# Patient Record
Sex: Male | Born: 1945 | Race: White | Hispanic: No | Marital: Single | Smoking: Former smoker
Health system: Southern US, Community
[De-identification: ages and names within clinical notes are randomized; demographics above are authoritative.]

## PROBLEM LIST (undated history)

## (undated) DIAGNOSIS — I10 Essential (primary) hypertension: Secondary | ICD-10-CM

## (undated) DIAGNOSIS — H9190 Unspecified hearing loss, unspecified ear: Secondary | ICD-10-CM

## (undated) DIAGNOSIS — N189 Chronic kidney disease, unspecified: Secondary | ICD-10-CM

## (undated) DIAGNOSIS — E785 Hyperlipidemia, unspecified: Secondary | ICD-10-CM

## (undated) DIAGNOSIS — I2510 Atherosclerotic heart disease of native coronary artery without angina pectoris: Secondary | ICD-10-CM

## (undated) DIAGNOSIS — E039 Hypothyroidism, unspecified: Secondary | ICD-10-CM

## (undated) DIAGNOSIS — N200 Calculus of kidney: Secondary | ICD-10-CM

## (undated) HISTORY — DX: Chronic kidney disease, unspecified: N18.9

## (undated) HISTORY — PX: LITHOTRIPSY: SUR834

## (undated) HISTORY — DX: Hyperlipidemia, unspecified: E78.5

## (undated) HISTORY — DX: Hypothyroidism, unspecified: E03.9

## (undated) HISTORY — DX: Atherosclerotic heart disease of native coronary artery without angina pectoris: I25.10

---

## 2000-08-20 ENCOUNTER — Encounter: Payer: Self-pay | Admitting: Urology

## 2000-08-20 ENCOUNTER — Ambulatory Visit (HOSPITAL_COMMUNITY): Admission: RE | Admit: 2000-08-20 | Discharge: 2000-08-20 | Payer: Self-pay | Admitting: Urology

## 2000-08-24 ENCOUNTER — Encounter: Payer: Self-pay | Admitting: Internal Medicine

## 2000-08-24 ENCOUNTER — Ambulatory Visit (HOSPITAL_COMMUNITY): Admission: RE | Admit: 2000-08-24 | Discharge: 2000-08-24 | Payer: Self-pay | Admitting: Internal Medicine

## 2000-09-10 ENCOUNTER — Ambulatory Visit (HOSPITAL_COMMUNITY): Admission: RE | Admit: 2000-09-10 | Discharge: 2000-09-10 | Payer: Self-pay

## 2000-09-10 ENCOUNTER — Encounter: Payer: Self-pay | Admitting: Urology

## 2000-09-28 ENCOUNTER — Encounter: Payer: Self-pay | Admitting: Urology

## 2000-09-28 ENCOUNTER — Ambulatory Visit (HOSPITAL_COMMUNITY): Admission: RE | Admit: 2000-09-28 | Discharge: 2000-09-28 | Payer: Self-pay | Admitting: Urology

## 2000-09-30 ENCOUNTER — Ambulatory Visit (HOSPITAL_COMMUNITY): Admission: RE | Admit: 2000-09-30 | Discharge: 2000-09-30 | Payer: Self-pay | Admitting: Urology

## 2000-09-30 ENCOUNTER — Encounter: Payer: Self-pay | Admitting: Urology

## 2000-10-01 ENCOUNTER — Ambulatory Visit (HOSPITAL_COMMUNITY): Admission: RE | Admit: 2000-10-01 | Discharge: 2000-10-01 | Payer: Self-pay | Admitting: Urology

## 2000-10-01 ENCOUNTER — Encounter: Payer: Self-pay | Admitting: Urology

## 2000-11-11 ENCOUNTER — Ambulatory Visit (HOSPITAL_COMMUNITY): Admission: RE | Admit: 2000-11-11 | Discharge: 2000-11-11 | Payer: Self-pay | Admitting: Urology

## 2000-11-11 ENCOUNTER — Encounter: Payer: Self-pay | Admitting: Urology

## 2001-06-07 ENCOUNTER — Encounter: Payer: Self-pay | Admitting: Urology

## 2001-06-07 ENCOUNTER — Ambulatory Visit (HOSPITAL_COMMUNITY): Admission: RE | Admit: 2001-06-07 | Discharge: 2001-06-07 | Payer: Self-pay | Admitting: Urology

## 2006-06-10 ENCOUNTER — Ambulatory Visit (HOSPITAL_COMMUNITY): Admission: RE | Admit: 2006-06-10 | Discharge: 2006-06-10 | Payer: Self-pay | Admitting: Family Medicine

## 2011-07-10 NOTE — H&P (Signed)
  NTS SOAP Note  Vital Signs:  Vitals as of: 07/10/2011: Systolic 149: Diastolic 87: Heart Rate 82: Temp 98.45F: Height 60ft 8in: Weight 181Lbs 0 Ounces: OFC Not Entered: Respiratory Rate Not Entered: O2 Saturation Not Entered: Pain Level Not Entered: BMI 28  BMI : 27.52 kg/m2  Subjective: This 66 Years 1 Months old Male presents forscreening TCS.  Denies any gi complaints.  Never has had a colonoscopy.  No family h/o colon cancer.  Review of Symptoms:  Constitutional:unremarkable Head:unremarkable Eyes:unremarkable Nose/Mouth/Throat:unremarkable Cardiovascular:unremarkable Respiratory:unremarkable Gastrointestinal:unremarkable Genitourinary:unremarkable Musculoskeletal:unremarkable Hematolgic/Lymphatic:unremarkable Allergic/Immunologic:unremarkable   Past Medical History:Reviewed   Past Medical History  Surgical History: none Medical Problems:  High Blood pressure Allergies: nkda Medications: exforge   Social History:Reviewed  Social History  Preferred Language: English ([place]) Race:  White Ethnicity: Not Hispanic / Latino Age: 66 Years 1 Months Marital Status:  S Alcohol: none Recreational drug(s):  No   Smoking Status: Former smoker reviewed on 07/10/2011 Started Date: 02/24/1973 Stopped Date: 02/24/1998   Family History:Reviewed   Family History  Is there a family history of:No family h/o colon cancer             Father:  Diabetes Type II, Coronary Artery Disease             Mother:  Stroke    Objective Information: General:Well appearing, well nourished in no distress. Head:Atraumatic; no masses; no abnormalities Neck:Supple without lymphadenopathy.  Heart:RRR, no murmur or gallop.  Normal S1, S2.  No S3, S4.  Lungs:CTA bilaterally, no wheezes, rhonchi, rales.  Breathing unlabored. Abdomen:Soft, NT/ND, no HSM, no masses. deferred to procedure  Assessment:Need for  screening TCS  Diagnosis &amp; Procedure: DiagnosisCode: V76.51, ProcedureCode: 04540,    Plan:Scheduled for screening TCS on 07/15/11.   Patient Education:Alternative treatments to surgery were discussed with patient (and family).Risks and benefits  of procedure were fully explained to the patient (and family) who gave informed consent. Patient/family questions were addressed.  Follow-up:Pending Surgery

## 2011-07-15 ENCOUNTER — Encounter (HOSPITAL_COMMUNITY): Payer: Self-pay | Admitting: *Deleted

## 2011-07-15 ENCOUNTER — Encounter (HOSPITAL_COMMUNITY)
Admission: RE | Disposition: A | Discharge status: Home / Self Care | Payer: Self-pay | Source: Ambulatory Visit | Attending: General Surgery

## 2011-07-15 ENCOUNTER — Ambulatory Visit (HOSPITAL_COMMUNITY)
Admission: RE | Admit: 2011-07-15 | Discharge: 2011-07-15 | Disposition: A | Discharge status: Home / Self Care | Payer: Medicare Other | Source: Ambulatory Visit | Attending: General Surgery | Admitting: General Surgery

## 2011-07-15 DIAGNOSIS — D126 Benign neoplasm of colon, unspecified: Secondary | ICD-10-CM | POA: Insufficient documentation

## 2011-07-15 DIAGNOSIS — Z79899 Other long term (current) drug therapy: Secondary | ICD-10-CM | POA: Insufficient documentation

## 2011-07-15 DIAGNOSIS — Z01812 Encounter for preprocedural laboratory examination: Secondary | ICD-10-CM | POA: Insufficient documentation

## 2011-07-15 DIAGNOSIS — Z1211 Encounter for screening for malignant neoplasm of colon: Secondary | ICD-10-CM | POA: Insufficient documentation

## 2011-07-15 DIAGNOSIS — I10 Essential (primary) hypertension: Secondary | ICD-10-CM | POA: Insufficient documentation

## 2011-07-15 DIAGNOSIS — D128 Benign neoplasm of rectum: Secondary | ICD-10-CM | POA: Insufficient documentation

## 2011-07-15 HISTORY — PX: COLONOSCOPY: SHX5424

## 2011-07-15 HISTORY — DX: Essential (primary) hypertension: I10

## 2011-07-15 SURGERY — COLONOSCOPY
Anesthesia: Moderate Sedation

## 2011-07-15 MED ORDER — STERILE WATER FOR IRRIGATION IR SOLN
Status: DC | PRN
  Administered 2011-07-15: 10:00:00

## 2011-07-15 MED ORDER — SODIUM CHLORIDE 0.45 % IV SOLN
Freq: Once | INTRAVENOUS | Status: AC
  Administered 2011-07-15: 10:00:00 via INTRAVENOUS

## 2011-07-15 MED ORDER — MEPERIDINE HCL 25 MG/ML IJ SOLN
INTRAMUSCULAR | Status: DC | PRN
  Administered 2011-07-15: 50 mg via INTRAVENOUS

## 2011-07-15 MED ORDER — MEPERIDINE HCL 100 MG/ML IJ SOLN
INTRAMUSCULAR | Status: AC
  Filled 2011-07-15: qty 1

## 2011-07-15 MED ORDER — MIDAZOLAM HCL 5 MG/5ML IJ SOLN
INTRAMUSCULAR | Status: DC | PRN
  Administered 2011-07-15 (×2): 1 mg via INTRAVENOUS
  Administered 2011-07-15: 3 mg via INTRAVENOUS

## 2011-07-15 MED ORDER — MIDAZOLAM HCL 5 MG/5ML IJ SOLN
INTRAMUSCULAR | Status: AC
  Filled 2011-07-15: qty 5

## 2011-07-15 NOTE — Discharge Instructions (Addendum)
Colonoscopy Care After Read the instructions outlined below and refer to this sheet in the next few weeks. These discharge instructions provide you with general information on caring for yourself after you leave the hospital. Your doctor may also give you specific instructions. While your treatment has been planned according to the most current medical practices available, unavoidable complications occasionally occur. If you have any problems or questions after discharge, call your doctor. HOME CARE INSTRUCTIONS ACTIVITY:  You may resume your regular activity, but move at a slower pace for the next 24 hours.   Take frequent rest periods for the next 24 hours.   Walking will help get rid of the air and reduce the bloated feeling in your belly (abdomen).   No driving for 24 hours (because of the medicine (anesthesia) used during the test).   You may shower.   Do not sign any important legal documents or operate any machinery for 24 hours (because of the anesthesia used during the test).  NUTRITION:  Drink plenty of fluids.   You may resume your normal diet as instructed by your doctor.   Begin with a light meal and progress to your normal diet. Heavy or fried foods are harder to digest and may make you feel sick to your stomach (nauseated).   Avoid alcoholic beverages for 24 hours or as instructed.  MEDICATIONS:  You may resume your normal medications unless your doctor tells you otherwise.  WHAT TO EXPECT TODAY:  Some feelings of bloating in the abdomen.   Passage of more gas than usual.   Spotting of blood in your stool or on the toilet paper.  IF YOU HAD POLYPS REMOVED DURING THE COLONOSCOPY:  No aspirin products for 7 days or as instructed.   No alcohol for 7 days or as instructed.   Eat a soft diet for the next 24 hours.  FINDING OUT THE RESULTS OF YOUR TEST Not all test results are available during your visit. If your test results are not back during the visit, make an  appointment with your caregiver to find out the results. Do not assume everything is normal if you have not heard from your caregiver or the medical facility. It is important for you to follow up on all of your test results.  SEEK IMMEDIATE MEDICAL CARE IF:  You have more than a spotting of blood in your stool.   Your belly is swollen (abdominal distention).   You are nauseated or vomiting.   You have a fever.   You have abdominal pain or discomfort that is severe or gets worse throughout the day.  Document Released: 09/25/2003 Document Revised: 01/30/2011 Document Reviewed: 09/23/2007 [facility] Patient Information 2012 [place], [place].Colon Polyps A polyp is extra tissue that grows inside your body. Colon polyps grow in the large intestine. The large intestine, also called the colon, is part of your digestive system. It is a long, hollow tube at the end of your digestive tract where your body makes and stores stool. Most polyps are not dangerous. They are benign. This means they are not cancerous. But over time, some types of polyps can turn into cancer. Polyps that are smaller than a pea are usually not harmful. But larger polyps could someday become or may already be cancerous. To be safe, doctors remove all polyps and test them.  WHO GETS POLYPS? Anyone can get polyps, but certain people are more likely than others. You may have a greater chance of getting polyps if:  You  are over 50.   You have had polyps before.   Someone in your family has had polyps.   Someone in your family has had cancer of the large intestine.   Find out if someone in your family has had polyps. You may also be more likely to get polyps if you:   Eat a lot of fatty foods.   Smoke.   Drink alcohol.   Do not exercise.   Eat too much.  SYMPTOMS  Most small polyps do not cause symptoms. People often do not know they have one until their caregiver finds it during a regular checkup or while testing them for  something else. Some people do have symptoms like these:  Bleeding from the anus. You might notice blood on your underwear or on toilet paper after you have had a bowel movement.   Constipation or diarrhea that lasts more than a week.   Blood in the stool. Blood can make stool look black or it can show up as red streaks in the stool.  If you have any of these symptoms, see your caregiver. HOW DOES THE DOCTOR TEST FOR POLYPS? The doctor can use four tests to check for polyps:  Digital rectal exam. The caregiver wears gloves and checks your rectum (the last part of the large intestine) to see if it feels normal. This test would find polyps only in the rectum. Your caregiver may need to do one of the other tests listed below to find polyps higher up in the intestine.   Barium enema. The caregiver puts a liquid called barium into your rectum before taking x-rays of your large intestine. Barium makes your intestine look white in the pictures. Polyps are dark, so they are easy to see.   Sigmoidoscopy. With this test, the caregiver can see inside your large intestine. A thin flexible tube is placed into your rectum. The device is called a sigmoidoscope, which has a light and a tiny video camera in it. The caregiver uses the sigmoidoscope to look at the last third of your large intestine.   Colonoscopy. This test is like sigmoidoscopy, but the caregiver looks at all of the large intestine. It usually requires sedation. This is the most common method for finding and removing polyps.  TREATMENT   The caregiver will remove the polyp during sigmoidoscopy or colonoscopy. The polyp is then tested for cancer.   If you have had polyps, your caregiver may want you to get tested regularly in the future.  PREVENTION  There is not one sure way to prevent polyps. You might be able to lower your risk of getting them if you:  Eat more fruits and vegetables and less fatty food.   Do not smoke.   Avoid  alcohol.   Exercise every day.   Lose weight if you are overweight.   Eating more calcium and folate can also lower your risk of getting polyps. Some foods that are rich in calcium are milk, cheese, and broccoli. Some foods that are rich in folate are chickpeas, kidney beans, and spinach.   Aspirin might help prevent polyps. Studies are under way.  Document Released: 11/07/2003 Document Revised: 01/30/2011 Document Reviewed: 04/14/2007 [facility] Patient Information 2012 [place], [place].Colon Polyps A polyp is extra tissue that grows inside your body. Colon polyps grow in the large intestine. The large intestine, also called the colon, is part of your digestive system. It is a long, hollow tube at the end of your digestive tract where your  body makes and stores stool. Most polyps are not dangerous. They are benign. This means they are not cancerous. But over time, some types of polyps can turn into cancer. Polyps that are smaller than a pea are usually not harmful. But larger polyps could someday become or may already be cancerous. To be safe, doctors remove all polyps and test them.  WHO GETS POLYPS? Anyone can get polyps, but certain people are more likely than others. You may have a greater chance of getting polyps if:  You are over 50.   You have had polyps before.   Someone in your family has had polyps.   Someone in your family has had cancer of the large intestine.   Find out if someone in your family has had polyps. You may also be more likely to get polyps if you:   Eat a lot of fatty foods.   Smoke.   Drink alcohol.   Do not exercise.   Eat too much.  SYMPTOMS  Most small polyps do not cause symptoms. People often do not know they have one until their caregiver finds it during a regular checkup or while testing them for something else. Some people do have symptoms like these:  Bleeding from the anus. You might notice blood on your underwear or on toilet paper after you  have had a bowel movement.   Constipation or diarrhea that lasts more than a week.   Blood in the stool. Blood can make stool look black or it can show up as red streaks in the stool.  If you have any of these symptoms, see your caregiver. HOW DOES THE DOCTOR TEST FOR POLYPS? The doctor can use four tests to check for polyps:  Digital rectal exam. The caregiver wears gloves and checks your rectum (the last part of the large intestine) to see if it feels normal. This test would find polyps only in the rectum. Your caregiver may need to do one of the other tests listed below to find polyps higher up in the intestine.   Barium enema. The caregiver puts a liquid called barium into your rectum before taking x-rays of your large intestine. Barium makes your intestine look white in the pictures. Polyps are dark, so they are easy to see.   Sigmoidoscopy. With this test, the caregiver can see inside your large intestine. A thin flexible tube is placed into your rectum. The device is called a sigmoidoscope, which has a light and a tiny video camera in it. The caregiver uses the sigmoidoscope to look at the last third of your large intestine.   Colonoscopy. This test is like sigmoidoscopy, but the caregiver looks at all of the large intestine. It usually requires sedation. This is the most common method for finding and removing polyps.  TREATMENT   The caregiver will remove the polyp during sigmoidoscopy or colonoscopy. The polyp is then tested for cancer.   If you have had polyps, your caregiver may want you to get tested regularly in the future.  PREVENTION  There is not one sure way to prevent polyps. You might be able to lower your risk of getting them if you:  Eat more fruits and vegetables and less fatty food.   Do not smoke.   Avoid alcohol.   Exercise every day.   Lose weight if you are overweight.   Eating more calcium and folate can also lower your risk of getting polyps. Some foods  that are rich in calcium are  milk, cheese, and broccoli. Some foods that are rich in folate are chickpeas, kidney beans, and spinach.   Aspirin might help prevent polyps. Studies are under way.  Document Released: 11/07/2003 Document Revised: 01/30/2011 Document Reviewed: 04/14/2007 [facility] Patient Information 2012 [place], [place].

## 2011-07-15 NOTE — Interval H&P Note (Signed)
History and Physical Interval Note:  07/15/2011 10:03 AM  [name]  has presented today for surgery, with the diagnosis of Special screening for malignant neoplasms, colon   The various methods of treatment have been discussed with the patient and family. After consideration of risks, benefits and other options for treatment, the patient has consented to  Procedure(s) (LRB): COLONOSCOPY (N/A) as a surgical intervention .  The patients' history has been reviewed, patient examined, no change in status, stable for surgery.  I have reviewed the patients' chart and labs.  Questions were answered to the patient's satisfaction.     Franky Macho A

## 2011-07-15 NOTE — Op Note (Signed)
[facility] [address] [place], [place]  [address]  COLONOSCOPY PROCEDURE REPORT  PATIENT:  Tanner, Bass  MR#:  [id number] BIRTHDATE:  08/12/1945, 66 yrs. old  GENDER:  male ENDOSCOPIST:  Franky Macho, MD REF. BY:  Assunta Found, M.D. PROCEDURE DATE:  07/15/2011 PROCEDURE:  Colonoscopy with snare polypectomy ASA CLASS:  Class II INDICATIONS:  Screening MEDICATIONS:   Versed 4 mg IV, demerol 50 mg IV  DESCRIPTION OF PROCEDURE:   After the risks benefits and alternatives of the procedure were thoroughly explained, informed consent was obtained.  Digital rectal exam was performed and revealed no abnormalities.   The [id number] ([id number]) endoscope was introduced through the anus and advanced to the cecum, which was identified by both the appendix and ileocecal valve, without limitations.  The quality of the prep was adequate..  The instrument was then slowly withdrawn as the colon was fully examined. <<PROCEDUREIMAGES>>  FINDINGS:  Two polyps were found in the proximal transverse colon (see image001, image002, image003).  A sessile polyp was found in the rectum (image 004).   All were removed using snare cautery and sent to pathology.  Retroflexed views in the rectum revealed it was not tolerated by the patient.  The scope was then withdrawn from the cecum and the procedure completed. COMPLICATIONS:  None ENDOSCOPIC IMPRESSION: 1) Two polyps in the proximal transverse colon 2) Sessile polyp in the rectum  3) Normal colonoscopy otherwise RECOMMENDATIONS: 1) Await pathology results REPEAT EXAM:  In 3 year(s) for Colonoscopy.  ______________________________ Franky Macho, MD  CC:  Assunta Found, MD  n. Rosalie DoctorFranky Macho at 07/15/2011 10:31 AM  Tommas Olp, [phone]

## 2011-07-16 LAB — GLUCOSE, CAPILLARY: Glucose-Capillary: 98 mg/dL (ref 70–99)

## 2011-07-17 ENCOUNTER — Encounter (HOSPITAL_COMMUNITY): Payer: Self-pay | Admitting: General Surgery

## 2012-03-17 ENCOUNTER — Encounter (HOSPITAL_COMMUNITY): Payer: Self-pay | Admitting: *Deleted

## 2012-03-17 ENCOUNTER — Emergency Department (HOSPITAL_COMMUNITY)
Admission: EM | Admit: 2012-03-17 | Discharge: 2012-03-18 | Disposition: A | Discharge status: Home / Self Care | Payer: Medicare Other | Attending: Emergency Medicine | Admitting: Emergency Medicine

## 2012-03-17 DIAGNOSIS — I10 Essential (primary) hypertension: Secondary | ICD-10-CM | POA: Insufficient documentation

## 2012-03-17 DIAGNOSIS — E119 Type 2 diabetes mellitus without complications: Secondary | ICD-10-CM | POA: Insufficient documentation

## 2012-03-17 DIAGNOSIS — Z87891 Personal history of nicotine dependence: Secondary | ICD-10-CM | POA: Insufficient documentation

## 2012-03-17 DIAGNOSIS — Z79899 Other long term (current) drug therapy: Secondary | ICD-10-CM | POA: Insufficient documentation

## 2012-03-17 DIAGNOSIS — R109 Unspecified abdominal pain: Secondary | ICD-10-CM | POA: Insufficient documentation

## 2012-03-17 DIAGNOSIS — N200 Calculus of kidney: Secondary | ICD-10-CM | POA: Insufficient documentation

## 2012-03-17 MED ORDER — SODIUM CHLORIDE 0.9 % IV SOLN
Freq: Once | INTRAVENOUS | Status: AC
  Administered 2012-03-18: via INTRAVENOUS

## 2012-03-17 MED ORDER — MORPHINE SULFATE 4 MG/ML IJ SOLN
2.0000 mg | Freq: Once | INTRAMUSCULAR | Status: AC
  Administered 2012-03-18: 2 mg via INTRAVENOUS
  Filled 2012-03-17: qty 1

## 2012-03-17 MED ORDER — ONDANSETRON HCL 4 MG/2ML IJ SOLN
4.0000 mg | Freq: Once | INTRAMUSCULAR | Status: AC
  Administered 2012-03-18: 4 mg via INTRAVENOUS
  Filled 2012-03-17: qty 2

## 2012-03-17 NOTE — ED Notes (Addendum)
Pt reporting sudden, sharp pain in LLQ. States pain began about 9:30 tonight.  Denies nausea and vomiting.  Denies problems with bowel movements. Pt reports that pain has lessened now, and is "just throbbing."

## 2012-03-18 ENCOUNTER — Emergency Department (HOSPITAL_COMMUNITY): Payer: Medicare Other

## 2012-03-18 LAB — BASIC METABOLIC PANEL
BUN: 15 mg/dL (ref 6–23)
Calcium: 9.5 mg/dL (ref 8.4–10.5)
Creatinine, Ser: 1.13 mg/dL (ref 0.50–1.35)
GFR calc non Af Amer: 65 mL/min — ABNORMAL LOW (ref >90)
Glucose, Bld: 128 mg/dL — ABNORMAL HIGH (ref 70–99)

## 2012-03-18 LAB — URINE MICROSCOPIC-ADD ON

## 2012-03-18 LAB — URINALYSIS, ROUTINE W REFLEX MICROSCOPIC
Bilirubin Urine: NEGATIVE
Protein, ur: NEGATIVE mg/dL
Urobilinogen, UA: 0.2 mg/dL (ref 0.0–1.0)

## 2012-03-18 LAB — CBC WITH DIFFERENTIAL/PLATELET
Eosinophils Absolute: 0.1 10*3/uL (ref 0.0–0.7)
Eosinophils Relative: 2 % (ref 0–5)
Lymphs Abs: 1.9 10*3/uL (ref 0.7–4.0)
MCH: 23.1 pg — ABNORMAL LOW (ref 26.0–34.0)
MCV: 75.3 fL — ABNORMAL LOW (ref 78.0–100.0)
Monocytes Relative: 7 % (ref 3–12)
Platelets: 205 10*3/uL (ref 150–400)
RBC: 4.41 MIL/uL (ref 4.22–5.81)

## 2012-03-18 MED ORDER — POTASSIUM CHLORIDE CRYS ER 20 MEQ PO TBCR
60.0000 meq | EXTENDED_RELEASE_TABLET | Freq: Once | ORAL | Status: AC
  Administered 2012-03-18: 60 meq via ORAL
  Filled 2012-03-18: qty 3

## 2012-03-18 MED ORDER — HYDROMORPHONE HCL PF 1 MG/ML IJ SOLN
1.0000 mg | Freq: Once | INTRAMUSCULAR | Status: DC
  Filled 2012-03-18: qty 1

## 2012-03-18 MED ORDER — HYDROCODONE-ACETAMINOPHEN 5-325 MG PO TABS: 1.0000 | ORAL_TABLET | ORAL | Status: AC | PRN

## 2012-03-18 MED ORDER — KETOROLAC TROMETHAMINE 30 MG/ML IJ SOLN
30.0000 mg | Freq: Once | INTRAMUSCULAR | Status: AC
  Administered 2012-03-18: 30 mg via INTRAVENOUS
  Filled 2012-03-18: qty 1

## 2012-03-18 MED ORDER — CIPROFLOXACIN HCL 500 MG PO TABS: 250.0000 mg | ORAL_TABLET | Freq: Two times a day (BID) | ORAL | Status: DC

## 2012-03-18 MED ORDER — ONDANSETRON HCL 4 MG PO TABS: 4.0000 mg | ORAL_TABLET | Freq: Four times a day (QID) | ORAL | Status: DC

## 2012-03-18 NOTE — ED Notes (Signed)
Discharge instructions given and reviewed with patient.  Prescriptions given for Vicodin, Zofran and Cipro; effects and use explained for each.  Patient verbalized understanding of sedating effects of Vicodin and to complete all Cipro.  Patient to follow up with Dr. Jerre Simon for an 8mm kidney stone.  Patient ambulatory; discharged home in good condition.

## 2012-03-18 NOTE — ED Notes (Signed)
Patient in radiology at this time. 

## 2012-03-18 NOTE — ED Notes (Signed)
CRITICAL VALUE ALERT  Critical value received: K 2.4 Date of notification: 03/18/12  Time of notification:  0031  Critical value read back: yes Nurse who received alert:  Jeannette How, RN  MD notified:Dr. Colon Branch  Time: 8413

## 2012-03-18 NOTE — ED Provider Notes (Signed)
History     CSN: [id number]  Arrival date & time 03/17/12  2330   First MD Initiated Contact with Patient 03/17/12 2341      Chief Complaint  Patient presents with  . Abdominal Pain    (Consider location/radiation/quality/duration/timing/severity/associated sxs/prior treatment) HPI [name] is a 67 y.o. male who presents to the Emergency Department complaining of burning abdominal pain to the left of the umbilicus that began around 2100. He has had two BMs since onset of the discomfort with no diarrhea. Pain is not associated with nausea, vomiting, or diarrhea. He has taken no medicines.   PCP Dr. Phillips Odor Past Medical History  Diagnosis Date  . Hypertension   . Diabetes mellitus   . Kidney stones     Past Surgical History  Procedure Date  . Lithotripsy     X 2  . Colonoscopy 07/15/2011    Procedure: COLONOSCOPY;  Surgeon: Dalia Heading, MD;  Location: [facility];  Service: Gastroenterology;  Laterality: N/A;    Family History  Problem Relation Age of Onset  . Colon cancer Paternal Uncle     History  Substance Use Topics  . Smoking status: Former Smoker -- 1.0 packs/day for 20 years    Types: Cigarettes  . Smokeless tobacco: Former Neurosurgeon    Types: Snuff, Chew  . Alcohol Use: No      Review of Systems  Constitutional: Negative for fever.       10 Systems reviewed and are negative for acute change except as noted in the HPI.  HENT: Negative for congestion.   Eyes: Negative for discharge and redness.  Respiratory: Negative for cough and shortness of breath.   Cardiovascular: Negative for chest pain.  Gastrointestinal: Positive for abdominal pain. Negative for vomiting.  Musculoskeletal: Negative for back pain.  Skin: Negative for rash.  Neurological: Negative for syncope, numbness and headaches.  Psychiatric/Behavioral:       No behavior change.    Allergies  Review of patient's allergies indicates no known allergies.  Home Medications    Current Outpatient Rx  Name  Route  Sig  Dispense  Refill  . AMLODIPINE BESYLATE-VALSARTAN 5-160 MG PO TABS   Oral   Take 1 tablet by mouth daily.         Marland Kitchen CLOTRIMAZOLE-BETAMETHASONE 1-0.05 % EX CREA   Topical   Apply topically 2 (two) times daily as needed.         Marland Kitchen GABAPENTIN 100 MG PO CAPS   Oral   Take 100 mg by mouth 2 (two) times daily.         Marland Kitchen METFORMIN HCL 500 MG PO TABS   Oral   Take 500 mg by mouth 2 (two) times daily with a meal.         . SIMVASTATIN 20 MG PO TABS   Oral   Take 20 mg by mouth daily.           BP 171/91  Pulse 77  Temp 98.4 F (36.9 C) (Oral)  Resp 16  Ht 5\' 8"  (1.727 m)  Wt 190 lb (86.183 kg)  BMI 28.89 kg/m2  SpO2 99%  Physical Exam  Nursing note and vitals reviewed. Constitutional:       Awake, alert, nontoxic appearance.  HENT:  Head: Atraumatic.  Eyes: Right eye exhibits no discharge. Left eye exhibits no discharge.  Neck: Neck supple.  Pulmonary/Chest: Effort normal. He exhibits no tenderness.  Abdominal: Soft. There is tenderness. There is no  rebound.       Mild tenderness to left abdomen with palpation.   Musculoskeletal: He exhibits no tenderness.       Baseline ROM, no obvious new focal weakness.  Neurological:       Mental status and motor strength appears baseline for patient and situation.  Skin: No rash noted.  Psychiatric: He has a normal mood and affect.    ED Course  Procedures (including critical care time) Results for orders placed during the hospital encounter of 03/17/12  CBC WITH DIFFERENTIAL      Component Value Range   WBC 6.2  4.0 - 10.5 K/uL   RBC 4.41  4.22 - 5.81 MIL/uL   Hemoglobin 10.2 (*) 13.0 - 17.0 g/dL   HCT 62.1 (*) 30.8 - 65.7 %   MCV 75.3 (*) 78.0 - 100.0 fL   MCH 23.1 (*) 26.0 - 34.0 pg   MCHC 30.7  30.0 - 36.0 g/dL   RDW 84.6 (*) 96.2 - 95.2 %   Platelets 205  150 - 400 K/uL   Neutrophils Relative 61  43 - 77 %   Neutro Abs 3.8  1.7 - 7.7 K/uL   Lymphocytes  Relative 30  12 - 46 %   Lymphs Abs 1.9  0.7 - 4.0 K/uL   Monocytes Relative 7  3 - 12 %   Monocytes Absolute 0.4  0.1 - 1.0 K/uL   Eosinophils Relative 2  0 - 5 %   Eosinophils Absolute 0.1  0.0 - 0.7 K/uL   Basophils Relative 0  0 - 1 %   Basophils Absolute 0.0  0.0 - 0.1 K/uL  BASIC METABOLIC PANEL      Component Value Range   Sodium 141  135 - 145 mEq/L   Potassium 2.4 (*) 3.5 - 5.1 mEq/L   Chloride 102  96 - 112 mEq/L   CO2 31  19 - 32 mEq/L   Glucose, Bld 128 (*) 70 - 99 mg/dL   BUN 15  6 - 23 mg/dL   Creatinine, Ser 8.41  0.50 - 1.35 mg/dL   Calcium 9.5  8.4 - 32.4 mg/dL   GFR calc non Af Amer 65 (*) >90 mL/min   GFR calc Af Amer 75 (*) >90 mL/min  URINALYSIS, ROUTINE W REFLEX MICROSCOPIC      Component Value Range   Color, Urine RED (*) YELLOW   APPearance HAZY (*) CLEAR   Specific Gravity, Urine 1.015  1.005 - 1.030   pH 7.0  5.0 - 8.0   Glucose, UA NEGATIVE  NEGATIVE mg/dL   Hgb urine dipstick LARGE (*) NEGATIVE   Bilirubin Urine NEGATIVE  NEGATIVE   Ketones, ur NEGATIVE  NEGATIVE mg/dL   Protein, ur NEGATIVE  NEGATIVE mg/dL   Urobilinogen, UA 0.2  0.0 - 1.0 mg/dL   Nitrite NEGATIVE  NEGATIVE   Leukocytes, UA TRACE (*) NEGATIVE  URINE MICROSCOPIC-ADD ON      Component Value Range   Squamous Epithelial / LPF RARE  RARE   WBC, UA 0-2  <3 WBC/hpf   RBC / HPF TOO NUMEROUS TO COUNT  <3 RBC/hpf   Bacteria, UA FEW (*) RARE   Urine-Other MUCOUS PRESENT      Dg Abd Acute W/chest  03/18/2012  *RADIOLOGY REPORT*  Clinical Data: Left lower quadrant pain.  History of kidney stones.  ACUTE ABDOMEN SERIES (ABDOMEN 2 VIEW & CHEST 1 VIEW)  Comparison: None.  Findings: Chest radiograph demonstrates clear lungs.  Streaky densities at  the lung bases may be related to vascular structures. Heart size is within normal limits.  No evidence for free air.  There are large calculi in the right abdomen that could be near the renal pelvis.  The conglomeration of stones measures 1.8 x 1.7 cm.  There are additional right kidney stones.  There is an 8 mm stone in the region of the proximal left ureter.  There are no definite stones in the pelvis.  There is a nonspecific bowel gas pattern.  IMPRESSION: 8 mm stone in the left abdomen.  The stone could be located in the proximal left ureter.  Multiple calcifications in the region of the right kidney and right renal pelvis. Conglomeration of stones measure up to 1.8 cm in this region.  Nonobstructive bowel gas pattern.  No acute chest findings.   Original Report Authenticated By: Richarda Overlie, M.D.    Mikkel.Chamorro Reviewed results with patient. He advised that last kidney stone required lithotripsy 9 years ago. He saw Dr. Jerre Simon at that time.  MDM  Patient with left sided abdominal pain that began tonight. Acute abdominal series with 8 mm stone on left. Calcifications in right kidney pelvis. Labs with low potassium at 2.4. Given potassium. Referral to urology. Reviewed results with patient. Pt stable in ED with no significant deterioration in condition.The patient appears reasonably screened and/or stabilized for discharge and I doubt any other medical condition or other [facility] requiring further screening, evaluation, or treatment in the ED at this time prior to discharge.  MDM Reviewed: nursing note and vitals Interpretation: labs and x-ray           Nicoletta Dress. Colon Branch, MD 03/18/12 [phone]

## 2012-03-22 ENCOUNTER — Other Ambulatory Visit (HOSPITAL_COMMUNITY): Payer: Self-pay | Admitting: Urology

## 2012-03-22 DIAGNOSIS — N201 Calculus of ureter: Secondary | ICD-10-CM

## 2012-03-22 DIAGNOSIS — N200 Calculus of kidney: Secondary | ICD-10-CM

## 2012-03-23 ENCOUNTER — Ambulatory Visit (HOSPITAL_COMMUNITY): Payer: Medicare Other

## 2012-03-26 ENCOUNTER — Ambulatory Visit (HOSPITAL_COMMUNITY)
Admission: RE | Admit: 2012-03-26 | Discharge: 2012-03-26 | Disposition: A | Discharge status: Home / Self Care | Payer: Medicare Other | Source: Ambulatory Visit | Attending: Urology | Admitting: Urology

## 2012-03-26 DIAGNOSIS — R109 Unspecified abdominal pain: Secondary | ICD-10-CM | POA: Insufficient documentation

## 2012-03-26 DIAGNOSIS — N201 Calculus of ureter: Secondary | ICD-10-CM | POA: Insufficient documentation

## 2012-03-26 DIAGNOSIS — N200 Calculus of kidney: Secondary | ICD-10-CM | POA: Insufficient documentation

## 2012-03-26 DIAGNOSIS — R918 Other nonspecific abnormal finding of lung field: Secondary | ICD-10-CM | POA: Insufficient documentation

## 2012-03-31 ENCOUNTER — Ambulatory Visit: Admit: 2012-03-31 | Payer: Self-pay | Admitting: Urology

## 2012-03-31 SURGERY — LITHOTRIPSY, ESWL
Anesthesia: Moderate Sedation | Laterality: Left

## 2012-05-19 ENCOUNTER — Encounter (HOSPITAL_COMMUNITY): Payer: Self-pay

## 2012-05-19 ENCOUNTER — Other Ambulatory Visit: Payer: Self-pay

## 2012-05-19 ENCOUNTER — Encounter (HOSPITAL_COMMUNITY)
Admission: RE | Admit: 2012-05-19 | Discharge: 2012-05-19 | Disposition: A | Discharge status: Home / Self Care | Payer: Medicare Other | Source: Ambulatory Visit | Attending: Urology | Admitting: Urology

## 2012-05-19 ENCOUNTER — Encounter (HOSPITAL_COMMUNITY): Payer: Self-pay | Admitting: Pharmacy Technician

## 2012-05-19 DIAGNOSIS — Z01812 Encounter for preprocedural laboratory examination: Secondary | ICD-10-CM | POA: Insufficient documentation

## 2012-05-19 DIAGNOSIS — N201 Calculus of ureter: Secondary | ICD-10-CM | POA: Insufficient documentation

## 2012-05-19 DIAGNOSIS — Z01810 Encounter for preprocedural cardiovascular examination: Secondary | ICD-10-CM | POA: Insufficient documentation

## 2012-05-19 DIAGNOSIS — Z5309 Procedure and treatment not carried out because of other contraindication: Secondary | ICD-10-CM | POA: Insufficient documentation

## 2012-05-19 HISTORY — DX: Calculus of kidney: N20.0

## 2012-05-19 LAB — BASIC METABOLIC PANEL
BUN: 18 mg/dL (ref 6–23)
CO2: 31 mEq/L (ref 19–32)
GFR calc non Af Amer: 41 mL/min — ABNORMAL LOW (ref >90)
Glucose, Bld: 98 mg/dL (ref 70–99)
Potassium: 2.6 mEq/L — CL (ref 3.5–5.1)
Sodium: 143 mEq/L (ref 135–145)

## 2012-05-19 NOTE — Progress Notes (Signed)
Pt's pre-op labwork for lithotripsy on 05/26/12 with Dr. Jerre Simon showed a critical K+ value of 2.6. Dr. Jerre Simon is currently out of town, so his PCP was contacted to report the critical value at 1230 on 05/19/12. Pt's PCP, Dr. Assunta Found, is currently out of town, so I spoke with Terie Purser, PA at his office. She said she would contact the pt and replace his potassium orally and have him see her in the office tomorrow.

## 2012-05-19 NOTE — Patient Instructions (Signed)
[name]  05/19/2012   Your procedure is scheduled on:  05/26/12  Report to [facility] at 12:30 PM.  Call this number if you have problems the morning of surgery: [phone]   Remember:   Do not eat food or drink liquids after midnight.   Take these medicines the morning of surgery with A SIP OF WATER: Exforge. Take Xanax if needed.   Do not wear jewelry, make-up or nail polish.  Do not wear lotions, powders, or perfumes. You may wear deodorant.  Do not shave 48 hours prior to surgery. Men may shave face and neck.  Do not bring valuables to the hospital.  Contacts, dentures or bridgework may not be worn into surgery.  Leave suitcase in the car. After surgery it may be brought to your room.  For patients admitted to the hospital, checkout time is 11:00 AM the day of discharge.   Patients discharged the day of surgery will not be allowed to drive home.    Special Instructions: N/A   Please read over the following fact sheets that you were given: Pain Booklet, Anesthesia Post-op Instructions and Care and Recovery After Surgery    Lithotripsy for Kidney Stones WHAT ARE KIDNEY STONES? The kidneys filter blood for chemicals the body cannot use. These waste chemicals are eliminated in the urine. They are removed from the body. Under some conditions, these chemicals may become concentrated. When this happens, they form crystals in the urine. When these crystals build up and stick together, stones may form. When these stones block the flow of urine through the urinary tract, they may cause severe pain. The urinary tract is very sensitive to blockage and stretching by the stone. WHAT IS LITHOTRIPSY? Lithotripsy is a treatment that can sometimes help eliminate kidney stones and pain faster. A form of lithotripsy, also known as ESWL (extracorporeal shock wave lithotripsy), is a nonsurgical procedure that helps your body rid itself of the kidney stone with a minimum amount of pain. EWSL is a  method of crushing a kidney stone with shock waves. These shock waves pass through your body. They cause the kidney stones to crumble while still in the urinary tract. It is then easier for the smaller pieces of stone to pass in the urine. Lithotripsy usually takes about an hour. It is done in a hospital, a lithotripsy center, or a mobile unit. It usually does not require an overnight stay. Your caregiver will instruct you on preparation for the procedure. Your caregiver will tell you what to expect afterward. LET YOUR CAREGIVER KNOW ABOUT:  Allergies.  Medicines taken including herbs, eye drops, over the counter medicines (including aspirin, aleve, or motrin for treatment of inflammatory conditions) and creams.  Use of steroids (by mouth or creams).  Previous problems with anesthetics or novocaine.  Possibility of pregnancy, if this applies.  History of blood clots (thrombophlebitis).  History of bleeding or blood problems.  Previous surgery.  Other health problems. RISKS AND COMPLICATIONS Complications of lithotripsy are uncommon, but include the following:  Infection.  Bleeding of the kidney.  Bruising of the kidney or skin.  Obstruction of the ureter (the passageway from the kidney to the bladder).  Failure of the stone to fragment (break apart). PROCEDURE A stent (flexible tube with holes) may be placed in your ureter. The ureter is the tube that transports the urine from the kidneys to the bladder. Your caregiver may place a stent before the procedure. This will help keep urine flowing from  the kidney if the fragments of the stone block the ureter. You may receive an intravenous (IV) line to give you fluids and medicines. These medicines may help you relax or make you sleep. During the procedure, you will lie comfortably on a fluid-filled cushion or in a warm-water bath. After an x-ray or ultrasound locates your stone, shock waves are aimed at the stone. If you are awake, you  may feel a tapping sensation (feeling) as the shock waves pass through your body. If large stone particles remain after treatment, a second procedure may be necessary at a later date. For comfort during the test:  Relax as much as possible.  Try to remain still as much as possible.  Try to follow instructions to speed up the test.  Let your caregiver know if you are uncomfortable, anxious, or in pain. AFTER THE PROCEDURE  After surgery, you will be taken to the recovery area. A nurse will watch and check your progress. Once you're awake, stable, and taking fluids well, you will be allowed to go home as long as there are no problems. You may be prescribed antibiotics (medicines that kill germs) to help prevent infection. You may also be prescribed pain medicine if needed. In a week or two, your doctor may remove your stent, if you have one. Your caregiver will check to see whether or not stone particles remain. PASSING THE STONE It may take anywhere from a day to several weeks for the stone particles to leave your body. During this time, drink at least 8 to 12 eight ounce glasses of water every day. It is normal for your urine to be cloudy or slightly bloody for a few weeks following this procedure. You may even see small pieces of stone in your urine. A slight fever and some pain are also normal. Your caregiver may ask you to strain your urine to collect some stone particles for chemical analysis. If you find particles while straining the urine, save them. Analysis tells you and the caregiver what the stone is made of. Knowing this may help prevent future stones. PREVENTING FUTURE STONES  Drink about 8 to 12, eight-ounce glasses of water every day.  Follow the diet your caregiver recommends.  Take your prescribed medicine.  See your caregiver regularly for checkups. SEEK IMMEDIATE MEDICAL CARE IF:  You develop an oral temperature above 102 F (38.9 C), or as your caregiver suggests.  Your  pain is not relieved by medicine.  You develop nausea (feeling sick to your stomach) and vomiting.  You develop heavy bleeding.  You have difficulty urinating. Document Released: 02/08/2000 Document Revised: 05/05/2011 Document Reviewed: 12/03/2007 [facility] Patient Information 2013 [place], [place].    PATIENT INSTRUCTIONS POST-ANESTHESIA  IMMEDIATELY FOLLOWING SURGERY:  Do not drive or operate machinery for the first twenty four hours after surgery.  Do not make any important decisions for twenty four hours after surgery or while taking narcotic pain medications or sedatives.  If you develop intractable nausea and vomiting or a severe headache please notify your doctor immediately.  FOLLOW-UP:  Please make an appointment with your surgeon as instructed. You do not need to follow up with anesthesia unless specifically instructed to do so.  WOUND CARE INSTRUCTIONS (if applicable):  Keep a dry clean dressing on the anesthesia/puncture wound site if there is drainage.  Once the wound has quit draining you may leave it open to air.  Generally you should leave the bandage intact for twenty four hours unless there is  drainage.  If the epidural site drains for more than 36-48 hours please call the anesthesia department.  QUESTIONS?:  Please feel free to call your physician or the hospital operator if you have any questions, and they will be happy to assist you.

## 2012-05-26 ENCOUNTER — Encounter (HOSPITAL_COMMUNITY): Admission: RE | Payer: Self-pay | Source: Ambulatory Visit

## 2012-05-26 ENCOUNTER — Ambulatory Visit (HOSPITAL_COMMUNITY): Admission: RE | Admit: 2012-05-26 | Payer: Medicare Other | Source: Ambulatory Visit | Admitting: Urology

## 2012-05-26 SURGERY — LITHOTRIPSY, ESWL
Anesthesia: Moderate Sedation | Laterality: Left

## 2012-05-26 NOTE — Progress Notes (Signed)
Office called to cancel procedure due to abnormal lab values.

## 2012-06-07 NOTE — H&P (Signed)
NAME:  [name], [name]             ACCOUNT NO.:  [id number]  MEDICAL RECORD NO.:  [id number]  LOCATION:                                 FACILITY:  PHYSICIAN:  Ky Barban, M.D.DATE OF BIRTH:  [birth year]  DATE OF ADMISSION:  05/26/2012 DATE OF DISCHARGE:  LH                             HISTORY & PHYSICAL   CHIEF COMPLAINT:  Left renal colic.  HISTORY OF PRESENT ILLNESS:  This patient is a 67 year old gentleman, who came to the emergency room in [place] in January.  Then, I have seen him in March 23, 2011.  At that time, he was having left renal colic, and I scheduled a CT scan because in the emergency room they just did plain x-rays.  CT scan showed that he has severe lower right hydroureter nephrosis with 14 mm stone in the right upper ureter, and he has another additional stone in the left upper ureter causing partial obstruction.  It is a 5 mm stone, he was hurting on the left side.  He has additional calculi in both of kidneys, more so on the right renal pelvis, so I have scheduled him for ESL for a left upper ureteral calculus.  He is coming as outpatient to undergo this procedure.  Its limitations, complications, and need for additional procedures have been discussed.  No guarantees about the results.  PAST MEDICAL HISTORY:  He is diabetic and takes metformin.  No history of chest pain or orthopnea.  FAMILY HISTORY:  No history of prostate cancer.  In the emergency room, they did acute abdominal series, which showed that he has stones in the right upper quadrant, which appeared to be the renal pelvis, 1.8 x 1.7 cm.  In addition to more stone in the right kidney, there is a 8 mm stone in the region of the proximal left ureter. So, I ordered a CT scan, it showed there is a 14 mm stone in the right upper ureter with severe hydroureter nephrosis and partial obstructing left upper ureteral calculus and also bilateral small renal calculi. So, I advised him to  undergo ESL for left upper ureteral calculus. Because he was having pain on that side.  Past history as I said, he has type 2 diabetes and takes oral medications and also has hypertension.  He had a history of an ESL.  He does not know whether it is left or the right side.  It looks like it is the right side.  Because the right renal calculus is well broken.  REVIEW OF SYSTEMS:  Unremarkable.  PHYSICAL EXAMINATION:  VITAL SIGNS:  Blood pressure 151/82, temperature 97.6. CENTRAL NERVOUS SYSTEM:  No gross neurological deficit. HEAD, NECK, EYES, ENT:  Negative. CHEST:  Symmetrical.  Normal breath sounds. HEART:  Regular sinus rhythm.  No murmur. ABDOMEN:  Soft, flat.  Liver, spleen, kidneys not palpable.  Left CVA tenderness 1+.  No masses palpable. GU:  External genitalia, circumcised, meatus adequate.  Testicles are normal.  Rectal examination is normal.  Sphincter tone.  No rectal mass. Prostate 1.5+ smooth and firm.  IMPRESSION:  Bilateral ureteral calculi with severe right hydroureteronephrosis, partial obstruction left kidney with minimal hydronephrosis.  PLAN:  ESL left upper ureteral calculus.  Procedure, limitation, complication, use of additional procedure was discussed.  He understands and want me to go ahead and schedule it.     Ky Barban, M.D.     MIJ/MEDQ  D:  05/25/2012  T:  05/26/2012  Job:  [id number]

## 2012-12-08 ENCOUNTER — Other Ambulatory Visit (HOSPITAL_COMMUNITY): Payer: Self-pay | Admitting: Urology

## 2012-12-08 DIAGNOSIS — N201 Calculus of ureter: Secondary | ICD-10-CM

## 2012-12-13 ENCOUNTER — Ambulatory Visit (HOSPITAL_COMMUNITY)
Admission: RE | Admit: 2012-12-13 | Discharge: 2012-12-13 | Disposition: A | Discharge status: Home / Self Care | Payer: PRIVATE HEALTH INSURANCE | Source: Ambulatory Visit | Attending: Urology | Admitting: Urology

## 2012-12-13 DIAGNOSIS — K8020 Calculus of gallbladder without cholecystitis without obstruction: Secondary | ICD-10-CM | POA: Insufficient documentation

## 2012-12-13 DIAGNOSIS — N201 Calculus of ureter: Secondary | ICD-10-CM | POA: Insufficient documentation

## 2012-12-13 DIAGNOSIS — R1032 Left lower quadrant pain: Secondary | ICD-10-CM | POA: Insufficient documentation

## 2012-12-22 ENCOUNTER — Encounter (HOSPITAL_COMMUNITY): Payer: Self-pay | Admitting: Pharmacy Technician

## 2012-12-22 NOTE — Patient Instructions (Signed)
Your procedure is scheduled on: 12/28/2012  Report to [facility] at  8:30   AM.  Call this number if you have problems the morning of surgery: [phone]   Remember:   Do not drink or eat food:After Midnight.  :  Take these medicines the morning of surgery with A SIP OF WATER: Exforge   Do not wear jewelry, make-up or nail polish.  Do not wear lotions, powders, or perfumes. You may wear deodorant.  Do not shave 48 hours prior to surgery. Men may shave face and neck.  Do not bring valuables to the hospital.  Contacts, dentures or bridgework may not be worn into surgery.  Leave suitcase in the car. After surgery it may be brought to your room.  For patients admitted to the hospital, checkout time is 11:00 AM the day of discharge.   Patients discharged the day of surgery will not be allowed to drive home.    Special Instructions: Shower using CHG 2 nights before surgery and the night before surgery.  If you shower the day of surgery use CHG.  Use special wash - you have one bottle of CHG for all showers.  You should use approximately 1/3 of the bottle for each shower.   Please read over the following fact sheets that you were given: Pain Booklet, MRSA Information, Surgical Site Infection Prevention and Care and Recovery After Surgery  Cystoscopy Care After Refer to this sheet in the next few weeks. These instructions provide you with information on caring for yourself after your procedure. Your caregiver may also give you more specific instructions. Your treatment has been planned according to current medical practices, but problems sometimes occur. Call your caregiver if you have any problems or questions after your procedure. HOME CARE INSTRUCTIONS  Things you can do to ease any discomfort after your procedure include:  Drinking enough water and fluids to keep your urine clear or pale yellow.  Taking a warm bath to relieve any burning feelings. SEEK IMMEDIATE MEDICAL CARE IF:   You  have an increase in blood in your urine.  You notice blood clots in your urine.  You have difficulty passing urine.  You have the chills.  You have abdominal pain.  You have a fever or persistent symptoms for more than 2 3 days.  You have a fever and your symptoms suddenly get worse. MAKE SURE YOU:   Understand these instructions.  Will watch your condition.  Will get help right away if you are not doing well or get worse. Document Released: 08/30/2004 Document Revised: 11/05/2011 Document Reviewed: 08/04/2011 [facility] Patient Information 2014 [place], [place]. PATIENT INSTRUCTIONS POST-ANESTHESIA  IMMEDIATELY FOLLOWING SURGERY:  Do not drive or operate machinery for the first twenty four hours after surgery.  Do not make any important decisions for twenty four hours after surgery or while taking narcotic pain medications or sedatives.  If you develop intractable nausea and vomiting or a severe headache please notify your doctor immediately.  FOLLOW-UP:  Please make an appointment with your surgeon as instructed. You do not need to follow up with anesthesia unless specifically instructed to do so.  WOUND CARE INSTRUCTIONS (if applicable):  Keep a dry clean dressing on the anesthesia/puncture wound site if there is drainage.  Once the wound has quit draining you may leave it open to air.  Generally you should leave the bandage intact for twenty four hours unless there is drainage.  If the epidural site drains for more than 36-48  hours please call the anesthesia department.  QUESTIONS?:  Please feel free to call your physician or the hospital operator if you have any questions, and they will be happy to assist you.

## 2012-12-23 ENCOUNTER — Encounter (HOSPITAL_COMMUNITY)
Admission: RE | Admit: 2012-12-23 | Discharge: 2012-12-23 | Disposition: A | Discharge status: Home / Self Care | Payer: Medicare Other | Source: Ambulatory Visit | Attending: Urology | Admitting: Urology

## 2012-12-23 DIAGNOSIS — Z01812 Encounter for preprocedural laboratory examination: Secondary | ICD-10-CM | POA: Insufficient documentation

## 2012-12-23 DIAGNOSIS — Z01818 Encounter for other preprocedural examination: Secondary | ICD-10-CM | POA: Insufficient documentation

## 2012-12-23 LAB — CBC
Hemoglobin: 11 g/dL — ABNORMAL LOW (ref 13.0–17.0)
MCH: 24.4 pg — ABNORMAL LOW (ref 26.0–34.0)
MCHC: 31.3 g/dL (ref 30.0–36.0)
MCV: 78 fL (ref 78.0–100.0)
RBC: 4.5 MIL/uL (ref 4.22–5.81)

## 2012-12-23 LAB — BASIC METABOLIC PANEL
BUN: 15 mg/dL (ref 6–23)
CO2: 30 mEq/L (ref 19–32)
Calcium: 9.5 mg/dL (ref 8.4–10.5)
Creatinine, Ser: 1.54 mg/dL — ABNORMAL HIGH (ref 0.50–1.35)
GFR calc non Af Amer: 44 mL/min — ABNORMAL LOW (ref >90)
Glucose, Bld: 102 mg/dL — ABNORMAL HIGH (ref 70–99)

## 2012-12-28 ENCOUNTER — Ambulatory Visit (HOSPITAL_COMMUNITY): Payer: Medicare Other | Admitting: Certified Registered"

## 2012-12-28 ENCOUNTER — Ambulatory Visit (HOSPITAL_COMMUNITY)
Admission: RE | Admit: 2012-12-28 | Discharge: 2012-12-28 | Disposition: A | Discharge status: Home / Self Care | Payer: Medicare Other | Source: Ambulatory Visit | Attending: Urology | Admitting: Urology

## 2012-12-28 ENCOUNTER — Encounter (HOSPITAL_COMMUNITY)
Admission: RE | Disposition: A | Discharge status: Home / Self Care | Payer: Self-pay | Source: Ambulatory Visit | Attending: Urology

## 2012-12-28 ENCOUNTER — Encounter (HOSPITAL_COMMUNITY): Payer: Medicare Other | Admitting: Certified Registered"

## 2012-12-28 ENCOUNTER — Ambulatory Visit (HOSPITAL_COMMUNITY): Payer: Medicare Other

## 2012-12-28 ENCOUNTER — Encounter (HOSPITAL_COMMUNITY): Payer: Self-pay | Admitting: *Deleted

## 2012-12-28 DIAGNOSIS — E119 Type 2 diabetes mellitus without complications: Secondary | ICD-10-CM | POA: Insufficient documentation

## 2012-12-28 DIAGNOSIS — N201 Calculus of ureter: Secondary | ICD-10-CM | POA: Insufficient documentation

## 2012-12-28 DIAGNOSIS — Z01812 Encounter for preprocedural laboratory examination: Secondary | ICD-10-CM | POA: Insufficient documentation

## 2012-12-28 HISTORY — PX: HOLMIUM LASER APPLICATION: SHX5852

## 2012-12-28 HISTORY — PX: STONE EXTRACTION WITH BASKET: SHX5318

## 2012-12-28 HISTORY — PX: CYSTOSCOPY W/ URETERAL STENT PLACEMENT: SHX1429

## 2012-12-28 LAB — GLUCOSE, CAPILLARY: Glucose-Capillary: 105 mg/dL — ABNORMAL HIGH (ref 70–99)

## 2012-12-28 SURGERY — CYSTOSCOPY, WITH RETROGRADE PYELOGRAM AND URETERAL STENT INSERTION
Anesthesia: General | Laterality: Left | Wound class: Clean Contaminated

## 2012-12-28 MED ORDER — LIDOCAINE HCL (CARDIAC) 10 MG/ML IV SOLN
INTRAVENOUS | Status: DC | PRN
  Administered 2012-12-28: 10 mg via INTRAVENOUS

## 2012-12-28 MED ORDER — CIPROFLOXACIN IN D5W 400 MG/200ML IV SOLN
200.0000 mg | Freq: Once | INTRAVENOUS | Status: AC
  Administered 2012-12-28: 200 mg via INTRAVENOUS

## 2012-12-28 MED ORDER — SEVOFLURANE IN SOLN
RESPIRATORY_TRACT | Status: AC
  Filled 2012-12-28: qty 250

## 2012-12-28 MED ORDER — CIPROFLOXACIN IN D5W 200 MG/100ML IV SOLN
INTRAVENOUS | Status: AC
  Filled 2012-12-28: qty 100

## 2012-12-28 MED ORDER — PROPOFOL 10 MG/ML IV BOLUS
INTRAVENOUS | Status: DC | PRN
  Administered 2012-12-28: 10 mg via INTRAVENOUS
  Administered 2012-12-28: 40 mg via INTRAVENOUS
  Administered 2012-12-28: 30 mg via INTRAVENOUS
  Administered 2012-12-28: 150 mg via INTRAVENOUS

## 2012-12-28 MED ORDER — FENTANYL CITRATE 0.05 MG/ML IJ SOLN
INTRAMUSCULAR | Status: DC | PRN
  Administered 2012-12-28 (×3): 25 ug via INTRAVENOUS
  Administered 2012-12-28: 50 ug via INTRAVENOUS
  Administered 2012-12-28: 25 ug via INTRAVENOUS

## 2012-12-28 MED ORDER — EPHEDRINE SULFATE 50 MG/ML IJ SOLN
INTRAMUSCULAR | Status: AC
  Filled 2012-12-28: qty 1

## 2012-12-28 MED ORDER — ONDANSETRON HCL 4 MG/2ML IJ SOLN
4.0000 mg | Freq: Once | INTRAMUSCULAR | Status: AC
  Administered 2012-12-28: 4 mg via INTRAVENOUS

## 2012-12-28 MED ORDER — EPHEDRINE SULFATE 50 MG/ML IJ SOLN
INTRAMUSCULAR | Status: DC | PRN
  Administered 2012-12-28: 10 mg via INTRAVENOUS

## 2012-12-28 MED ORDER — FENTANYL CITRATE 0.05 MG/ML IJ SOLN
INTRAMUSCULAR | Status: AC
  Filled 2012-12-28: qty 2

## 2012-12-28 MED ORDER — FENTANYL CITRATE 0.05 MG/ML IJ SOLN
25.0000 ug | INTRAMUSCULAR | Status: AC
  Administered 2012-12-28 (×2): 25 ug via INTRAVENOUS

## 2012-12-28 MED ORDER — FENTANYL CITRATE 0.05 MG/ML IJ SOLN: 25.0000 ug | INTRAMUSCULAR | Status: DC | PRN

## 2012-12-28 MED ORDER — SODIUM CHLORIDE 0.9 % IR SOLN
Status: DC | PRN
  Administered 2012-12-28 (×6): 3000 mL

## 2012-12-28 MED ORDER — MIDAZOLAM HCL 2 MG/2ML IJ SOLN
1.0000 mg | INTRAMUSCULAR | Status: DC | PRN
  Administered 2012-12-28: 2 mg via INTRAVENOUS

## 2012-12-28 MED ORDER — PROPOFOL 10 MG/ML IV EMUL
INTRAVENOUS | Status: AC
  Filled 2012-12-28: qty 20

## 2012-12-28 MED ORDER — 0.9 % SODIUM CHLORIDE (POUR BTL) OPTIME
TOPICAL | Status: DC | PRN
  Administered 2012-12-28 (×2): 1000 mL

## 2012-12-28 MED ORDER — MIDAZOLAM HCL 2 MG/2ML IJ SOLN
INTRAMUSCULAR | Status: AC
  Filled 2012-12-28: qty 2

## 2012-12-28 MED ORDER — LACTATED RINGERS IV SOLN
INTRAVENOUS | Status: DC
  Administered 2012-12-28 (×2): via INTRAVENOUS

## 2012-12-28 MED ORDER — ONDANSETRON HCL 4 MG/2ML IJ SOLN
INTRAMUSCULAR | Status: AC
  Filled 2012-12-28: qty 2

## 2012-12-28 MED ORDER — STERILE WATER FOR IRRIGATION IR SOLN
Status: DC | PRN
  Administered 2012-12-28: 1000 mL

## 2012-12-28 MED ORDER — IOHEXOL 350 MG/ML SOLN
INTRAVENOUS | Status: DC | PRN
  Administered 2012-12-28: 50 mL

## 2012-12-28 MED ORDER — ONDANSETRON HCL 4 MG/2ML IJ SOLN: 4.0000 mg | Freq: Once | INTRAMUSCULAR | Status: DC | PRN

## 2012-12-28 MED ORDER — OXYCODONE-ACETAMINOPHEN 7.5-325 MG PO TABS: 1.0000 | ORAL_TABLET | Freq: Four times a day (QID) | ORAL | Status: DC | PRN

## 2012-12-28 SURGICAL SUPPLY — 29 items
BAG DRAIN URO TABLE W/ADPT NS (DRAPE) ×2 IMPLANT
BAG DRN 8 ADPR NS SKTRN CSTL (DRAPE) ×1
CATH 5 FR WEDGE TIP (UROLOGICAL SUPPLIES) ×2 IMPLANT
CATH OPEN TIP 5FR (CATHETERS) ×2 IMPLANT
CLOTH BEACON ORANGE TIMEOUT ST (SAFETY) ×2 IMPLANT
DECANTER SPIKE VIAL GLASS SM (MISCELLANEOUS) ×2 IMPLANT
DILATOR UROMAX ULTRA (MISCELLANEOUS) ×1 IMPLANT
GLOVE BIO SURGEON STRL SZ7 (GLOVE) ×2 IMPLANT
GLOVE BIOGEL PI IND STRL 6.5 (GLOVE) IMPLANT
GLOVE BIOGEL PI INDICATOR 6.5 (GLOVE) ×2
GLOVE ECLIPSE 7.0 STRL STRAW (GLOVE) ×1 IMPLANT
GLOVE INDICATOR 7.5 STRL GRN (GLOVE) ×1 IMPLANT
GLOVE SS BIOGEL STRL SZ 6.5 (GLOVE) IMPLANT
GLOVE SUPERSENSE BIOGEL SZ 6.5 (GLOVE) ×1
GOWN STRL REIN XL XLG (GOWN DISPOSABLE) ×4 IMPLANT
GUIDEWIRE STR BENTSON 035X150 (WIRE) ×1 IMPLANT
IV NS IRRIG 3000ML ARTHROMATIC (IV SOLUTION) ×8 IMPLANT
KIT ROOM TURNOVER AP CYSTO (KITS) ×2 IMPLANT
LASER FIBER DISP (UROLOGICAL SUPPLIES) ×1 IMPLANT
LASER FIBER DISP 1000U (UROLOGICAL SUPPLIES) IMPLANT
MANIFOLD NEPTUNE II (INSTRUMENTS) ×2 IMPLANT
NS IRRIG 1000ML POUR BTL (IV SOLUTION) ×2 IMPLANT
PACK CYSTO (CUSTOM PROCEDURE TRAY) ×2 IMPLANT
PAD ARMBOARD 7.5X6 YLW CONV (MISCELLANEOUS) ×2 IMPLANT
SET IRRIGATING DISP (SET/KITS/TRAYS/PACK) ×2 IMPLANT
STENT PERCUFLEX 4.8FRX24 (STENTS) ×1 IMPLANT
STONE RETRIEVAL GEMINI 2.4 FR (MISCELLANEOUS) ×1 IMPLANT
TOWEL OR 17X26 4PK STRL BLUE (TOWEL DISPOSABLE) ×2 IMPLANT
WIRE GUIDE BENTSON .035 15CM (WIRE) ×4 IMPLANT

## 2012-12-28 NOTE — Progress Notes (Signed)
Awake. Denies pain. Refuses po fluids. 

## 2012-12-28 NOTE — Anesthesia Preprocedure Evaluation (Signed)
Anesthesia Evaluation  Patient identified by MRN, date of birth, ID band Patient awake    Reviewed: Allergy & Precautions, H&P , NPO status , Patient's Chart, lab work & pertinent test results  Airway Mallampati: II TM Distance: >3 FB Neck ROM: Full    Dental  (+) Edentulous Upper and Edentulous Lower   Pulmonary neg pulmonary ROS,  breath sounds clear to auscultation        Cardiovascular hypertension, Pt. on medications Rhythm:Regular Rate:Normal     Neuro/Psych    GI/Hepatic negative GI ROS,   Endo/Other  diabetes, Well Controlled, Type 2  Renal/GU Renal disease     Musculoskeletal   Abdominal   Peds  Hematology   Anesthesia Other Findings   Reproductive/Obstetrics                           Anesthesia Physical Anesthesia Plan  ASA: II  Anesthesia Plan: General   Post-op Pain Management:    Induction: Intravenous  Airway Management Planned: LMA  Additional Equipment:   Intra-op Plan:   Post-operative Plan: Extubation in OR  Informed Consent: I have reviewed the patients History and Physical, chart, labs and discussed the procedure including the risks, benefits and alternatives for the proposed anesthesia with the patient or authorized representative who has indicated his/her understanding and acceptance.     Plan Discussed with:   Anesthesia Plan Comments:         Anesthesia Quick Evaluation

## 2012-12-28 NOTE — Transfer of Care (Signed)
Immediate Anesthesia Transfer of Care Note  Patient: [name]  Procedure(s) Performed: Procedure(s) (LRB): CYSTOSCOPY WITH RETROGRADE PYELOGRAM/LEFT URETERAL STENT PLACEMENT (Left) STONE EXTRACTION WITH BASKET (Left) HOLMIUM LASER APPLICATION (Left)  Patient Location: PACU  Anesthesia Type: General  Level of Consciousness: awake  Airway & Oxygen Therapy: Patient Spontanous Breathing and non-rebreather face mask  Post-op Assessment: Report given to PACU RN, Post -op Vital signs reviewed and stable and Patient moving all extremities  Post vital signs: Reviewed and stable  Complications: No apparent anesthesia complications

## 2012-12-28 NOTE — Anesthesia Procedure Notes (Signed)
Procedure Name: LMA Insertion Date/Time: 12/28/2012 10:30 AM Performed by: Franco Nones Pre-anesthesia Checklist: Patient identified, Patient being monitored, Emergency Drugs available, Timeout performed and Suction available Patient Re-evaluated:Patient Re-evaluated prior to inductionOxygen Delivery Method: Circle System Utilized Preoxygenation: Pre-oxygenation with 100% oxygen Intubation Type: IV induction Ventilation: Mask ventilation without difficulty LMA: LMA inserted LMA Size: 4.0 Number of attempts: 1 Placement Confirmation: positive ETCO2 and breath sounds checked- equal and bilateral Tube secured with: Tape

## 2012-12-28 NOTE — Progress Notes (Signed)
No change in H&P on reexamination. 

## 2012-12-28 NOTE — Op Note (Signed)
Op note [phone]

## 2012-12-28 NOTE — Anesthesia Postprocedure Evaluation (Signed)
Anesthesia Post Note  Patient: [name]  Procedure(s) Performed: Procedure(s) (LRB): CYSTOSCOPY WITH RETROGRADE PYELOGRAM/LEFT URETERAL STENT PLACEMENT (Left) STONE EXTRACTION WITH BASKET (Left) HOLMIUM LASER APPLICATION (Left)  Anesthesia type: General  Patient location: PACU  Post pain: Pain level controlled  Post assessment: Post-op Vital signs reviewed, Patient's Cardiovascular Status Stable, Respiratory Function Stable, Patent Airway, No signs of Nausea or vomiting and Pain level controlled  Last Vitals:  Filed Vitals:   12/28/12 1325  BP: 128/75  Pulse: 72  Temp: 36.7 C  Resp: 12    Post vital signs: Reviewed and stable  Level of consciousness: awake and alert   Complications: No apparent anesthesia complications  Blood glucose 105

## 2012-12-28 NOTE — H&P (Signed)
NAME:  [name], [name]             ACCOUNT NO.:  [id number]  MEDICAL RECORD NO.:  [id number]  LOCATION:                                 FACILITY:  PHYSICIAN:  Ky Barban, M.D.DATE OF BIRTH:  1945/04/28  DATE OF ADMISSION:  12/28/2012 DATE OF DISCHARGE:  LH                             HISTORY & PHYSICAL   CHIEF COMPLAINT:  Left renal colic.  HISTORY:  A 67 year old gentleman was seen by me in the Emergency Room in [facility] in on March 22, 2012.  CT scan showed that he has bilateral ureteral calculi.  There was a small calculus on the left side, 5 mm on the left side and 14 mm on the right side.  There was severe right hydronephrosis.  He was having pain on the left side, so I decided to treat with ESL on the left side.  It was given part of the stone was broken and came out.  The stone has now moved down into the distal left ureter causing partial obstruction.  He is still having pain on that side, so I have advised him to undergo ESL for which he is coming as an outpatient tomorrow.  I will do left retrograde pyelogram, ureteroscopic stone basket extraction, holmium laser lithotripsy, and use of double-J stent under anesthesia as an outpatient.  Then subsequently, I will do ESL on the right side to get rid of that stone from the right side.  I will see if I can put a stent on the right side also tomorrow.  I have discussed the procedure of stone basket in detail with the patient.  I have told him the complication of stone basket as: 1. Ureteral perforation leading to open surgery, requiring more period     to recover. 2. Stone migration may not be able to remove the left ureteral     calculus.  He understands and want me to go ahead and also I have     discussed the use of double-J stent, and he is coming as an     outpatient, he will undergo stone basket on the left side, possible     insertion of bilateral ureteral stents.  PAST MEDICAL HISTORY:  He has  type 2 diabetes, takes metformin, no history of chest pain, orthopnea, PND, nausea, vomiting.  FAMILY HISTORY:  No history of prostate cancer.  REVIEW OF SYSTEMS:  Unremarkable.  PHYSICAL EXAMINATION:  VITAL SIGNS:  Blood pressure 151/82, temperature is normal. CENTRAL NERVOUS SYSTEM:  No gross neurological deficit. HEAD, NECK, EYE, ENT:  Negative. CHEST:  Symmetrical.  No worse sounds. HEART:  Regular sinus rhythm.  No murmur. ABDOMEN:  Soft, flat.  Liver, spleen, kidneys not palpable.  There is 1+ left CVA tenderness. GU:  External genitalia is circumcised.  Meatus adequate.  Testicles are normal. RECTAL:  Prostate 1.5+ smooth and firm.  IMPRESSION:  Bilateral ureteral calculi with severe right hydroureteronephrosis, partial obstructing left ureteral calculus with left flank pain, bilateral small renal calculi.  PLAN:  Cystoscopy, left retrograde pyelogram, ureteroscopic stone basket extraction, insertion of double-J stent on left side, possible right ureteral stent insertion.  I have already done ESL when the  left ureteral stone was in the left upper ureter.  It was done in 06/30/2022 this year.  He has passed part of the stone, but not complete and his CT scan was repeated again on December 14, 2012.  It shows large obstructing calculus in the right upper ureter measuring 1.3 cm.  It is visible on a scout image.  There is moderate right-sided hydronephrosis, which is similar to the prior study.  Perinephric soft tissue stranding has improved, several nonobstructing right renal calculi.  Previously demonstrated left proximal ureteral calculus has progressed distally into the distal ureter, 2 cm above the ureterovesical junction.  It measures about 6 mm in size.     Ky Barban, M.D.     MIJ/MEDQ  D:  12/27/2012  T:  12/28/2012  Job:  [id number]

## 2012-12-29 NOTE — Op Note (Signed)
NAME:  [name], [name]             ACCOUNT NO.:  [id number]  MEDICAL RECORD NO.:  [id number]  LOCATION:  [facility]                          FACILITY:  [facility]  PHYSICIAN:  Ky Barban, M.D.DATE OF BIRTH:  17-Jun-1945  DATE OF PROCEDURE: DATE OF DISCHARGE:  12/28/2012                              OPERATIVE REPORT   PREOPERATIVE DIAGNOSIS:  Distal left ureteral calculus.  POSTOPERATIVE DIAGNOSIS:  Distal left ureteral calculus.  PROCEDURE:  Cystoscopy, left retrograde pyelogram, ureteroscopic stone basket extraction, holmium laser lithotripsy, insertion of double-J stent.  DESCRIPTION OF PROCEDURE:  The patient under general endotracheal anesthesia, in lithotomy position.  After usual prep and drape, #25 cystoscope introduced into the bladder.  I tried to find the orifice. Neither orifice is visible.  There is chronic inflammation in the bladder which is trabeculated.  With some difficulty, I was able to locate the left ureteral orifice and I injected the dye, it goes up into the lower ureter.  There appears to be a filling defect in the distal left ureter that could be the stone.  I passed a Glidewire up into the renal pelvis.  Over the [facility], I tried to pass an open-end catheter, #4.  It will not go beyond the stone.  So with some difficulty, I simply dilated the intramural ureter.  I have showed the guidewire was in the renal pelvis.  Once the intramural ureter was dilated, I introduced short rigid ureteroscope, went to the level of the stone.  The stone is visualized and under direct vision, the stone is broken with the help of the laser.  The laser setting was at 0.5 energy joules, rate 15.  I used 7.5 watts, total joules used 0.55 kilojoules.  Once the stone was broken, then with help of the basket, I was able to extract the pieces of stone.  When the stone was out, I was able to put double-J stent into the left renal ureter and it is positioned between the renal pelvis  and the bladder, nice loop is obtained in the renal pelvis and the bladder after removing the guidewire.  I tried to find the orifice on the right, so I can put a stent but I was unable to find an orifice on that side, so I quit the procedure at this point, all the instruments were removed. The patient left the operating room in satisfactory condition.     Ky Barban, M.D.     MIJ/MEDQ  D:  12/28/2012  T:  12/29/2012  Job:  [id number]

## 2012-12-31 ENCOUNTER — Encounter (HOSPITAL_COMMUNITY): Payer: Self-pay | Admitting: Urology

## 2013-01-01 LAB — STONE ANALYSIS: Stone Weight KSTONE: 0.028 g

## 2013-01-05 ENCOUNTER — Encounter (HOSPITAL_COMMUNITY)
Admission: RE | Disposition: A | Discharge status: Home / Self Care | Payer: Self-pay | Source: Ambulatory Visit | Attending: Urology

## 2013-01-05 ENCOUNTER — Encounter (HOSPITAL_COMMUNITY): Payer: Self-pay | Admitting: *Deleted

## 2013-01-05 ENCOUNTER — Ambulatory Visit (HOSPITAL_COMMUNITY): Payer: PRIVATE HEALTH INSURANCE

## 2013-01-05 ENCOUNTER — Ambulatory Visit (HOSPITAL_COMMUNITY)
Admission: RE | Admit: 2013-01-05 | Discharge: 2013-01-05 | Disposition: A | Discharge status: Home / Self Care | Payer: PRIVATE HEALTH INSURANCE | Source: Ambulatory Visit | Attending: Urology | Admitting: Urology

## 2013-01-05 DIAGNOSIS — I10 Essential (primary) hypertension: Secondary | ICD-10-CM | POA: Insufficient documentation

## 2013-01-05 DIAGNOSIS — N201 Calculus of ureter: Secondary | ICD-10-CM | POA: Insufficient documentation

## 2013-01-05 HISTORY — PX: EXTRACORPOREAL SHOCK WAVE LITHOTRIPSY: SHX1557

## 2013-01-05 SURGERY — LITHOTRIPSY, ESWL
Anesthesia: Moderate Sedation | Laterality: Right

## 2013-01-05 MED ORDER — SODIUM CHLORIDE 0.9 % IV SOLN
Freq: Once | INTRAVENOUS | Status: AC
  Administered 2013-01-05: 14:00:00 via INTRAVENOUS

## 2013-01-05 MED ORDER — DIAZEPAM 5 MG PO TABS
10.0000 mg | ORAL_TABLET | Freq: Once | ORAL | Status: AC
  Administered 2013-01-05: 10 mg via ORAL
  Filled 2013-01-05: qty 2

## 2013-01-05 MED ORDER — DIPHENHYDRAMINE HCL 25 MG PO CAPS
25.0000 mg | ORAL_CAPSULE | Freq: Once | ORAL | Status: AC
  Administered 2013-01-05: 25 mg via ORAL
  Filled 2013-01-05: qty 1

## 2013-01-05 SURGICAL SUPPLY — 3 items
CLOTH BEACON ORANGE TIMEOUT ST (SAFETY) IMPLANT
GOWN STRL REIN XL XLG (GOWN DISPOSABLE) IMPLANT
TOWEL OR 17X26 4PK STRL BLUE (TOWEL DISPOSABLE) IMPLANT

## 2013-01-06 ENCOUNTER — Encounter (HOSPITAL_COMMUNITY): Payer: Self-pay | Admitting: Urology

## 2013-01-07 NOTE — H&P (Signed)
NAME:  [name], [name]             ACCOUNT NO.:  [id number]  MEDICAL RECORD NO.:  [id number]  LOCATION:  [facility]                          FACILITY:  [facility]  PHYSICIAN:  Ky Barban, M.D.DATE OF BIRTH:  [birth year]  DATE OF ADMISSION:  12/28/2012 DATE OF DISCHARGE:  11/04/2014LH                             HISTORY & PHYSICAL   CHIEF COMPLAINT:  Right ureteral calculus.  HISTORY:  A 67 year old gentleman who had stones in both the left and right ureter.  Left upper ureteral calculus was few months ago, treated with ESL.  He passed part of the stone but he was still having pain on that side, so last week, I took him to the OR, a stone basket was done, left a double-J stent.  The stone was completely removed.  He is doing fine.  He has a 13 mm stone in the right upper ureter causing partial obstruction.  He is not having any pain on the right side but stone is rather large, so I told him we need to go ahead and treat it.  He want me to do lithotripsy because he does not want to have a stone basket, and he is coming as outpatient.  Will do ESL tomorrow as outpatient.  PAST MEDICAL HISTORY:  He is type 2 diabetic.  He is on metformin.  No history chest pain, orthopnea, PND, nausea, vomiting, fever, or chills.  FAMILY HISTORY:  No history of prostate cancer.  PERSONAL HISTORY:  Does not smoke or drink.  REVIEW OF SYSTEMS:  Unremarkable.  PHYSICAL EXAMINATION:  VITAL SIGNS:  His blood pressure is 150/82, temperature is 98.6. CENTRAL NERVOUS SYSTEM:  No gross neurological deficit. HEAD, NECK, EYES, ENT:  Negative. CHEST:  Symmetrical.  Normal breath sounds. HEART:  Regular sinus rhythm.  No murmur. ABDOMEN:  Soft and flat.  Liver, spleen, and kidneys are not palpable. No CVA tenderness.  No abdominal masses palpable.  EXTERNAL GENITALIA: Circumcised.  Meatus is adequate.  Testicles are normal. RECTAL:  Normal. EXTREMITIES:  Normal.  IMPRESSION:  Right upper ureteral  calculus.  PLAN:  ESL right upper ureteral calculus.  Procedure, limitation, and complications are discussed.  He is familiar with the procedure and he is coming as outpatient.  I will do the procedure tomorrow as outpatient.     Ky Barban, M.D.     MIJ/MEDQ  D:  01/04/2013  T:  01/05/2013  Job:  [id number]

## 2013-01-19 ENCOUNTER — Ambulatory Visit (HOSPITAL_COMMUNITY)
Admission: RE | Admit: 2013-01-19 | Discharge: 2013-01-19 | Disposition: A | Discharge status: Home / Self Care | Payer: PRIVATE HEALTH INSURANCE | Source: Ambulatory Visit | Attending: Urology | Admitting: Urology

## 2013-01-19 ENCOUNTER — Other Ambulatory Visit (HOSPITAL_COMMUNITY): Payer: Self-pay | Admitting: Urology

## 2013-01-19 DIAGNOSIS — N200 Calculus of kidney: Secondary | ICD-10-CM | POA: Insufficient documentation

## 2013-01-19 DIAGNOSIS — N201 Calculus of ureter: Secondary | ICD-10-CM | POA: Insufficient documentation

## 2013-01-24 ENCOUNTER — Encounter (HOSPITAL_COMMUNITY): Payer: Self-pay | Admitting: Pharmacy Technician

## 2013-01-24 ENCOUNTER — Encounter (HOSPITAL_COMMUNITY)
Admission: RE | Admit: 2013-01-24 | Discharge: 2013-01-24 | Disposition: A | Discharge status: Home / Self Care | Payer: PRIVATE HEALTH INSURANCE | Source: Ambulatory Visit | Attending: Urology | Admitting: Urology

## 2013-01-24 ENCOUNTER — Encounter (HOSPITAL_COMMUNITY): Payer: Self-pay

## 2013-01-24 NOTE — Patient Instructions (Signed)
[name]  01/24/2013   Your procedure is scheduled on:  01/25/2013  Report to [facility] at  615  AM.  Call this number if you have problems the morning of surgery: [phone]   Remember:   Do not eat food or drink liquids after midnight.   Take these medicines the morning of surgery with A SIP OF WATER:  Exforge, percocet   Do not wear jewelry, make-up or nail polish.  Do not wear lotions, powders, or perfumes.   Do not shave 48 hours prior to surgery. Men may shave face and neck.  Do not bring valuables to the hospital.  [facility] is not responsible for any belongings or valuables.               Contacts, dentures or bridgework may not be worn into surgery.  Leave suitcase in the car. After surgery it may be brought to your room.  For patients admitted to the hospital, discharge time is determined by your treatment team.               Patients discharged the day of surgery will not be allowed to drive home.  Name and phone number of your driver: family  Special Instructions: Shower using CHG 2 nights before surgery and the night before surgery.  If you shower the day of surgery use CHG.  Use special wash - you have one bottle of CHG for all showers.  You should use approximately 1/3 of the bottle for each shower.   Please read over the following fact sheets that you were given: Pain Booklet, Coughing and Deep Breathing, Surgical Site Infection Prevention, Anesthesia Post-op Instructions and Care and Recovery After Surgery Cystoscopy Cystoscopy is a procedure that is used to help your caregiver diagnose and sometimes treat conditions that affect your lower urinary tract. Your lower urinary tract includes your bladder and the tube through which urine passes from your bladder out of your body (urethra). Cystoscopy is performed with a thin, tube-shaped instrument (cystoscope). The cystoscope has lenses and a light at the end so that your caregiver can see inside your  bladder. The cystoscope is inserted at the entrance of your urethra. Your caregiver guides it through your urethra and into your bladder. There are two main types of cystoscopy:  Flexible cystoscopy (with a flexible cystoscope).  Rigid cystoscopy (with a rigid cystoscope). Cystoscopy may be recommended for many conditions, including:  Urinary tract infections.  Blood in your urine (hematuria).  Loss of bladder control (urinary incontinence) or overactive bladder.  Unusual cells found in a urine sample.  Urinary blockage.  Painful urination. Cystoscopy may also be done to remove a sample of your tissue to be checked under a microscope (biopsy). It may also be done to remove or destroy bladder stones. LET YOUR CAREGIVER KNOW ABOUT:  Allergies to food or medicine.  Medicines taken, including vitamins, herbs, eyedrops, over-the-counter medicines, and creams.  Use of steroids (by mouth or creams).  Previous problems with anesthetics or numbing medicines.  History of bleeding problems or blood clots.  Previous surgery.  Other health problems, including diabetes and kidney problems.  Possibility of pregnancy, if this applies. PROCEDURE The area around the opening to your urethra will be cleaned. A medicine to numb your urethra (local anesthetic) is used. If a tissue sample or stone is removed during the procedure, you may be given a medicine to make you sleep (general anesthetic). Your caregiver will gently  insert the tip of the cystoscope into your urethra. The cystoscope will be slowly glided through your urethra and into your bladder. Sterile fluid will flow through the cystoscope and into your bladder. The fluid will expand and stretch your bladder. This gives your caregiver a better view of your bladder walls. The procedure lasts about 15 20 minutes. AFTER THE PROCEDURE If a local anesthetic is used, you will be allowed to go home as soon as you are ready. If a general  anesthetic is used, you will be taken to a recovery area until you are stable. You may have temporary bleeding and burning on urination. Document Released: 02/08/2000 Document Revised: 11/05/2011 Document Reviewed: 08/04/2011 [facility] Patient Information 2014 [place], [place]. PATIENT INSTRUCTIONS POST-ANESTHESIA  IMMEDIATELY FOLLOWING SURGERY:  Do not drive or operate machinery for the first twenty four hours after surgery.  Do not make any important decisions for twenty four hours after surgery or while taking narcotic pain medications or sedatives.  If you develop intractable nausea and vomiting or a severe headache please notify your doctor immediately.  FOLLOW-UP:  Please make an appointment with your surgeon as instructed. You do not need to follow up with anesthesia unless specifically instructed to do so.  WOUND CARE INSTRUCTIONS (if applicable):  Keep a dry clean dressing on the anesthesia/puncture wound site if there is drainage.  Once the wound has quit draining you may leave it open to air.  Generally you should leave the bandage intact for twenty four hours unless there is drainage.  If the epidural site drains for more than 36-48 hours please call the anesthesia department.  QUESTIONS?:  Please feel free to call your physician or the hospital operator if you have any questions, and they will be happy to assist you.

## 2013-01-24 NOTE — Progress Notes (Signed)
Patient told that he needs a competent adult to drive home and  stay with him for twenty-four hours after surgery.

## 2013-01-25 ENCOUNTER — Ambulatory Visit (HOSPITAL_COMMUNITY): Payer: PRIVATE HEALTH INSURANCE

## 2013-01-25 ENCOUNTER — Ambulatory Visit (HOSPITAL_COMMUNITY)
Admission: RE | Admit: 2013-01-25 | Discharge: 2013-01-25 | Disposition: A | Discharge status: Home / Self Care | Payer: PRIVATE HEALTH INSURANCE | Source: Ambulatory Visit | Attending: Urology | Admitting: Urology

## 2013-01-25 ENCOUNTER — Encounter (HOSPITAL_COMMUNITY)
Admission: RE | Disposition: A | Discharge status: Home / Self Care | Payer: Self-pay | Source: Ambulatory Visit | Attending: Urology

## 2013-01-25 ENCOUNTER — Encounter (HOSPITAL_COMMUNITY): Payer: Self-pay | Admitting: *Deleted

## 2013-01-25 ENCOUNTER — Ambulatory Visit (HOSPITAL_COMMUNITY): Payer: PRIVATE HEALTH INSURANCE | Admitting: Anesthesiology

## 2013-01-25 ENCOUNTER — Encounter (HOSPITAL_COMMUNITY): Payer: PRIVATE HEALTH INSURANCE | Admitting: Anesthesiology

## 2013-01-25 DIAGNOSIS — E119 Type 2 diabetes mellitus without complications: Secondary | ICD-10-CM | POA: Insufficient documentation

## 2013-01-25 DIAGNOSIS — N1330 Unspecified hydronephrosis: Secondary | ICD-10-CM | POA: Insufficient documentation

## 2013-01-25 DIAGNOSIS — N200 Calculus of kidney: Secondary | ICD-10-CM | POA: Insufficient documentation

## 2013-01-25 DIAGNOSIS — Z466 Encounter for fitting and adjustment of urinary device: Secondary | ICD-10-CM | POA: Insufficient documentation

## 2013-01-25 DIAGNOSIS — Z01812 Encounter for preprocedural laboratory examination: Secondary | ICD-10-CM | POA: Insufficient documentation

## 2013-01-25 HISTORY — PX: CYSTOSCOPY W/ RETROGRADES: SHX1426

## 2013-01-25 HISTORY — PX: CYSTOSCOPY W/ URETERAL STENT REMOVAL: SHX1430

## 2013-01-25 SURGERY — REMOVAL, STENT, URETER, CYSTOSCOPIC
Anesthesia: General | Laterality: Right

## 2013-01-25 MED ORDER — ONDANSETRON HCL 4 MG/2ML IJ SOLN
INTRAMUSCULAR | Status: AC
  Filled 2013-01-25: qty 2

## 2013-01-25 MED ORDER — FENTANYL CITRATE 0.05 MG/ML IJ SOLN
25.0000 ug | INTRAMUSCULAR | Status: AC
  Administered 2013-01-25 (×2): 25 ug via INTRAVENOUS

## 2013-01-25 MED ORDER — FENTANYL CITRATE 0.05 MG/ML IJ SOLN
INTRAMUSCULAR | Status: AC
  Filled 2013-01-25: qty 2

## 2013-01-25 MED ORDER — FENTANYL CITRATE 0.05 MG/ML IJ SOLN
INTRAMUSCULAR | Status: DC | PRN
  Administered 2013-01-25: 25 ug via INTRAVENOUS
  Administered 2013-01-25: 50 ug via INTRAVENOUS
  Administered 2013-01-25 (×2): 25 ug via INTRAVENOUS
  Administered 2013-01-25: 50 ug via INTRAVENOUS
  Administered 2013-01-25 (×3): 25 ug via INTRAVENOUS

## 2013-01-25 MED ORDER — EPHEDRINE SULFATE 50 MG/ML IJ SOLN
INTRAMUSCULAR | Status: AC
  Filled 2013-01-25: qty 1

## 2013-01-25 MED ORDER — MIDAZOLAM HCL 2 MG/2ML IJ SOLN
INTRAMUSCULAR | Status: AC
Start: 2013-01-25 — End: 2013-01-25
  Filled 2013-01-25: qty 2

## 2013-01-25 MED ORDER — ONDANSETRON HCL 4 MG/2ML IJ SOLN
4.0000 mg | Freq: Once | INTRAMUSCULAR | Status: AC
  Administered 2013-01-25: 4 mg via INTRAVENOUS

## 2013-01-25 MED ORDER — SODIUM CHLORIDE 0.9 % IR SOLN
Status: DC | PRN
  Administered 2013-01-25: 3000 mL

## 2013-01-25 MED ORDER — PROPOFOL 10 MG/ML IV BOLUS
INTRAVENOUS | Status: AC
  Filled 2013-01-25: qty 20

## 2013-01-25 MED ORDER — ONDANSETRON HCL 4 MG/2ML IJ SOLN: 4.0000 mg | Freq: Once | INTRAMUSCULAR | Status: DC | PRN

## 2013-01-25 MED ORDER — SODIUM CHLORIDE 0.9 % IV SOLN
INTRAVENOUS | Status: DC | PRN
  Administered 2013-01-25 (×2): 1000 mL

## 2013-01-25 MED ORDER — PROPOFOL 10 MG/ML IV BOLUS
INTRAVENOUS | Status: DC | PRN
  Administered 2013-01-25: 150 mg via INTRAVENOUS

## 2013-01-25 MED ORDER — SUCCINYLCHOLINE CHLORIDE 20 MG/ML IJ SOLN
INTRAMUSCULAR | Status: AC
  Filled 2013-01-25: qty 1

## 2013-01-25 MED ORDER — FENTANYL CITRATE 0.05 MG/ML IJ SOLN: 25.0000 ug | INTRAMUSCULAR | Status: DC | PRN

## 2013-01-25 MED ORDER — SODIUM CHLORIDE BACTERIOSTATIC 0.9 % IJ SOLN
INTRAMUSCULAR | Status: AC
  Filled 2013-01-25: qty 10

## 2013-01-25 MED ORDER — MIDAZOLAM HCL 2 MG/2ML IJ SOLN
1.0000 mg | INTRAMUSCULAR | Status: DC | PRN
  Administered 2013-01-25: 2 mg via INTRAVENOUS

## 2013-01-25 MED ORDER — LIDOCAINE HCL (CARDIAC) 20 MG/ML IV SOLN
INTRAVENOUS | Status: DC | PRN
  Administered 2013-01-25: 50 mg via INTRAVENOUS

## 2013-01-25 MED ORDER — LIDOCAINE HCL (PF) 1 % IJ SOLN
INTRAMUSCULAR | Status: AC
  Filled 2013-01-25: qty 5

## 2013-01-25 MED ORDER — LACTATED RINGERS IV SOLN
INTRAVENOUS | Status: DC
  Administered 2013-01-25: 1000 mL via INTRAVENOUS

## 2013-01-25 MED ORDER — LACTATED RINGERS IV SOLN
INTRAVENOUS | Status: DC | PRN
  Administered 2013-01-25 (×2): via INTRAVENOUS

## 2013-01-25 MED ORDER — IOHEXOL 350 MG/ML SOLN
INTRAVENOUS | Status: DC | PRN
  Administered 2013-01-25: 3 mL

## 2013-01-25 SURGICAL SUPPLY — 27 items
BAG DRAIN URO TABLE W/ADPT NS (DRAPE) ×3 IMPLANT
BAG DRN 8 ADPR NS SKTRN CSTL (DRAPE) ×2
BAG HAMPER (MISCELLANEOUS) ×3 IMPLANT
CATH OPEN TIP 5FR (CATHETERS) ×1 IMPLANT
CLOTH BEACON ORANGE TIMEOUT ST (SAFETY) ×3 IMPLANT
COVER MAYO STAND XLG (DRAPE) ×1 IMPLANT
DILATOR UROMAX ULTRA (MISCELLANEOUS) ×1 IMPLANT
GLOVE BIO SURGEON STRL SZ7 (GLOVE) ×3 IMPLANT
GLOVE BIOGEL PI IND STRL 7.0 (GLOVE) IMPLANT
GLOVE BIOGEL PI IND STRL 7.5 (GLOVE) IMPLANT
GLOVE BIOGEL PI INDICATOR 7.0 (GLOVE) ×1
GLOVE BIOGEL PI INDICATOR 7.5 (GLOVE) ×1
GLOVE EXAM NITRILE MD LF STRL (GLOVE) ×1 IMPLANT
GOWN STRL REIN XL XLG (GOWN DISPOSABLE) ×3 IMPLANT
GUIDEWIRE STR BENTSON 035X150 (WIRE) ×1 IMPLANT
IV NS 1000ML (IV SOLUTION) ×6
IV NS 1000ML BAXH (IV SOLUTION) IMPLANT
IV NS IRRIG 3000ML ARTHROMATIC (IV SOLUTION) ×1 IMPLANT
KIT ROOM TURNOVER AP CYSTO (KITS) ×3 IMPLANT
MANIFOLD NEPTUNE II (INSTRUMENTS) ×3 IMPLANT
PACK CYSTO (CUSTOM PROCEDURE TRAY) ×3 IMPLANT
PAD ARMBOARD 7.5X6 YLW CONV (MISCELLANEOUS) ×3 IMPLANT
SET IRRIGATING DISP (SET/KITS/TRAYS/PACK) ×3 IMPLANT
STENT PERCUFLEX 4.8FRX24 (STENTS) ×1 IMPLANT
TOWEL OR 17X26 4PK STRL BLUE (TOWEL DISPOSABLE) ×3 IMPLANT
WATER STERILE IRR 1000ML POUR (IV SOLUTION) ×3 IMPLANT
WIRE GUIDE BENTSON .035 15CM (WIRE) ×1 IMPLANT

## 2013-01-25 NOTE — Progress Notes (Signed)
Voided 400 ml of clear red urine.

## 2013-01-25 NOTE — Progress Notes (Signed)
No change in H&P on reexamination. 

## 2013-01-25 NOTE — Anesthesia Postprocedure Evaluation (Signed)
  Anesthesia Post-op Note  Patient: DELOS KLICH  Procedure(s) Performed: Procedure(s): CYSTOSCOPY WITH LEFT URETERALSTENT REMOVAL (Left) CYSTOSCOPY WITH RIGHT RETROGRADE PYELOGRAM, BALLOON DILATION RIGHT URETER AND RIGHT URETERAL STENT PLACEMENT (Right)  Patient Location: PACU  Anesthesia Type:General  Level of Consciousness: awake, alert , oriented and patient cooperative  Airway and Oxygen Therapy: Patient Spontanous Breathing and Patient connected to nasal cannula oxygen  Post-op Pain: none  Post-op Assessment: Post-op Vital signs reviewed, Patient's Cardiovascular Status Stable, Respiratory Function Stable, Patent Airway, No signs of Nausea or vomiting and Pain level controlled  Post-op Vital Signs: Reviewed and stable  Complications: No apparent anesthesia complications

## 2013-01-25 NOTE — Transfer of Care (Signed)
Immediate Anesthesia Transfer of Care Note  Patient: [name]  Procedure(s) Performed: Procedure(s): CYSTOSCOPY WITH LEFT URETERALSTENT REMOVAL (Left) CYSTOSCOPY WITH RIGHT RETROGRADE PYELOGRAM, BALLOON DILATION RIGHT URETER AND RIGHT URETERAL STENT PLACEMENT (Right)  Patient Location: PACU  Anesthesia Type:General  Level of Consciousness: awake, alert , oriented and patient cooperative  Airway & Oxygen Therapy: Patient Spontanous Breathing and Patient connected to nasal cannula oxygen  Post-op Assessment: Report given to PACU RN and Post -op Vital signs reviewed and stable  Post vital signs: Reviewed and stable  Complications: No apparent anesthesia complications

## 2013-01-25 NOTE — H&P (Signed)
NAME: CARDEN, TEEL ACCOUNT NO.: [id number]  MEDICAL RECORD NO.: [id number]  LOCATION: FACILITY:  PHYSICIAN: Ky Barban, M.D.DATE OF BIRTH: March 21, 1945  DATE OF ADMISSION: 12/28/2012  DATE OF DISCHARGE: LH  HISTORY & PHYSICAL  CHIEF COMPLAINT: Left renal colic.  HISTORY: A 67 year old gentleman was seen by me in the Emergency Room  in [facility] in on March 22, 2012. CT scan showed that he  has bilateral ureteral calculi. There was a small calculus on the left  side, 5 mm on the left side and 14 mm on the right side. There was  severe right hydronephrosis. He was having pain on the left side, so I  decided to treat with ESL on the left side. It was given part of the  stone was broken and came out. The stone has now moved down into the  distal left ureter causing partial obstruction. He is still having pain  on that side, so I have advised him to undergo ESL for which he is  coming as an outpatient tomorrow. I will do left retrograde pyelogram,  ureteroscopic stone basket extraction, holmium laser lithotripsy, and  use of double-J stent under anesthesia as an outpatient. Then  subsequently, I will do ESL on the right side to get rid of that stone  from the right side. I will see if I can put a stent on the right side  also tomorrow. I have discussed the procedure of stone basket in detail  with the patient. I have told him the complication of stone basket as:  1. Ureteral perforation leading to open surgery, requiring more period  to recover.  2. Stone migration may not be able to remove the left ureteral  calculus. He understands and want me to go ahead and also I have  discussed the use of double-J stent, and he is coming as an  outpatient, he will undergo stone basket on the left side, possible  insertion of bilateral ureteral stents.  PAST MEDICAL HISTORY: He has type 2 diabetes, takes metformin, no  history of chest pain, orthopnea, PND, nausea, vomiting.   FAMILY HISTORY: No history of prostate cancer.  REVIEW OF SYSTEMS: Unremarkable.  PHYSICAL EXAMINATION: VITAL SIGNS: Blood pressure 151/82, temperature  is normal.  CENTRAL NERVOUS SYSTEM: No gross neurological deficit.  HEAD, NECK, EYE, ENT: Negative.  CHEST: Symmetrical. No worse sounds.  HEART: Regular sinus rhythm. No murmur.  ABDOMEN: Soft, flat. Liver, spleen, kidneys not palpable. There is 1+  left CVA tenderness.  GU: External genitalia is circumcised. Meatus adequate. Testicles are  normal.  RECTAL: Prostate 1.5+ smooth and firm.  IMPRESSION: Bilateral ureteral calculi with severe right  hydroureteronephrosis, partial obstructing left ureteral calculus with  left flank pain, bilateral small renal calculi.  PLAN: Cystoscopy, left retrograde pyelogram, ureteroscopic stone basket  extraction, insertion of double-J stent on left side, possible right  ureteral stent insertion.  I have already done ESL when the left ureteral stone was in the left  upper ureter. It was done in 27-Jun-2022 this year. He has passed part of  the stone, but not complete and his CT scan was repeated again on  December 14, 2012. It shows large obstructing calculus in the right  upper ureter measuring 1.3 cm. It is visible on a scout image. There  is moderate right-sided hydronephrosis, which is similar to the prior  study. Perinephric soft tissue stranding has improved, several  nonobstructing right renal calculi. Previously demonstrated left  proximal ureteral calculus has progressed  distally into the distal  ureter, 2 cm above the ureterovesical junction. It measures about 6 mm  in size.  Ky Barban, M.D.

## 2013-01-25 NOTE — Preoperative (Signed)
Beta Blockers   Reason not to administer Beta Blockers:Not Applicable 

## 2013-01-25 NOTE — H&P (Signed)
NAME:  [name], [name]             ACCOUNT NO.:  [id number]  MEDICAL RECORD NO.:  [id number]  LOCATION:                                 FACILITY:  PHYSICIAN:  Ky Barban, M.D.DATE OF BIRTH:  11-22-1945  DATE OF ADMISSION:  01/25/2013 DATE OF DISCHARGE:  LH                             HISTORY & PHYSICAL   CHIEF COMPLAINT:  Left double-J stent.  Mr. [name] is a 67 year old gentleman, he has undergone stone basket with holmium laser lithotripsy.  First, the stone was located in the upper left ureter, causing him to have pain.  It was treated with lithotripsy.  He was able to get rid of the pain, but he passed several pieces of stone, and he still had several pieces stuck in the lower ureter.  It appeared to be big stone, so I had to do a stone basket, took him back to the operating room, and holmium laser was used to break the stone further.  The stone, I thought I removed completely, but there is still a small piece.  There, I left a double-J stent.  He is not having any pain.  He is coming tomorrow to have double-J stent removed under anesthesia as an outpatient.  There is a possibility the stone can come out as outpatient and after I removed the stent and that is what I will have to do.  He also has a stone in the right upper ureter, which we have treated with lithotripsy and this stone is partially broken, but most of the stone is still there.  He will need to have repeat lithotripsy of right upper ureteral calculus, but tomorrow, he is coming as outpatient to undergo stent removal under anesthesia.  PAST MEDICAL HISTORY:  Type 2 diabetes, well under control.  No history of any cardiopulmonary problem.  FAMILY HISTORY:  No history of prostate cancer.  REVIEW OF SYSTEMS:  Unremarkable.  PERSONAL HISTORY:  Does not smoke or drink.  PHYSICAL EXAMINATION:  VITAL SIGNS:  On examination, blood pressure is 161/86, temperature is normal. CENTRAL NERVOUS SYSTEM:   No gross neurological deficit. HEAD, NECK, EYES, AND ENT:  Negative. CHEST:  Symmetrical.  Normal breath sounds. ABDOMEN:  Soft, flat.  Liver, spleen, kidney is not palpable.  No CVA tenderness. EXTERNAL GENITALIA:  Circumcised meatus adequate.  Testicles are normal. RECTAL:  Deferred. EXTREMITIES:  Normal.  IMPRESSION:  Left ureteral stent with a small calculus.  PLAN:  Removal of stent under anesthesia as outpatient.  Hopefully, the stone will also come once the stent is out.  He also has right upper ureteral calculus, which is asymptomatic.  He already had 1 ESL done and he scheduled a second ESL in the right upper ureteral calculus as outpatient.  I have explained the procedure, limitations, and complications to the patient.  He understands and wants me to go ahead and proceed tomorrow morning.     Ky Barban, M.D.     MIJ/MEDQ  D:  01/24/2013  T:  01/25/2013  Job:  [id number]

## 2013-01-25 NOTE — Anesthesia Procedure Notes (Signed)
Procedure Name: LMA Insertion Date/Time: 01/25/2013 8:34 AM Performed by: Pernell Dupre, Paul Trettin A Pre-anesthesia Checklist: Patient identified, Timeout performed, Emergency Drugs available, Suction available and Patient being monitored Patient Re-evaluated:Patient Re-evaluated prior to inductionOxygen Delivery Method: Circle system utilized Preoxygenation: Pre-oxygenation with 100% oxygen Intubation Type: IV induction Ventilation: Mask ventilation without difficulty LMA: LMA inserted LMA Size: 4.0 Placement Confirmation: positive ETCO2 and breath sounds checked- equal and bilateral Tube secured with: Tape Dental Injury: Teeth and Oropharynx as per pre-operative assessment

## 2013-01-25 NOTE — Progress Notes (Signed)
Report to B Morris, Charity fundraiser.

## 2013-01-25 NOTE — Op Note (Signed)
Note dictated#[id number]

## 2013-01-25 NOTE — Anesthesia Preprocedure Evaluation (Signed)
Anesthesia Evaluation  Patient identified by MRN, date of birth, ID band Patient awake    Reviewed: Allergy & Precautions, H&P , NPO status , Patient's Chart, lab work & pertinent test results  Airway Mallampati: II TM Distance: >3 FB Neck ROM: Full    Dental  (+) Edentulous Upper and Edentulous Lower   Pulmonary neg pulmonary ROS, former smoker,  breath sounds clear to auscultation        Cardiovascular hypertension, Pt. on medications Rhythm:Regular Rate:Normal     Neuro/Psych    GI/Hepatic negative GI ROS,   Endo/Other  diabetes, Well Controlled, Type 2  Renal/GU Renal disease     Musculoskeletal   Abdominal   Peds  Hematology   Anesthesia Other Findings   Reproductive/Obstetrics                           Anesthesia Physical Anesthesia Plan  ASA: II  Anesthesia Plan: General   Post-op Pain Management:    Induction: Intravenous  Airway Management Planned: LMA  Additional Equipment:   Intra-op Plan:   Post-operative Plan: Extubation in OR  Informed Consent: I have reviewed the patients History and Physical, chart, labs and discussed the procedure including the risks, benefits and alternatives for the proposed anesthesia with the patient or authorized representative who has indicated his/her understanding and acceptance.     Plan Discussed with:   Anesthesia Plan Comments:         Anesthesia Quick Evaluation

## 2013-01-26 ENCOUNTER — Other Ambulatory Visit (HOSPITAL_COMMUNITY): Payer: PRIVATE HEALTH INSURANCE

## 2013-01-27 NOTE — Op Note (Signed)
NAME:  [name], [name]             ACCOUNT NO.:  [id number]  MEDICAL RECORD NO.:  [id number]  LOCATION:                                 FACILITY:  PHYSICIAN:  Ky Barban, M.D.DATE OF BIRTH:  14-Aug-1945  DATE OF PROCEDURE:  01/25/2013 DATE OF DISCHARGE:                              OPERATIVE REPORT   PREOPERATIVE DIAGNOSIS:  Left ureteral stent.  He had left ureteral stent since he had left ureteroscopic stone basket extraction.  He still has a small calculus in the distal left ureter. Hopefully, after removing the stent, it will come out.  DESCRIPTION OF PROCEDURE:  Under general endotracheal anesthesia in lithotomy position after usual prep and drape, a #25 cystoscope introduced into the bladder with the right-angle lens.  I was able to see the stent, great with the help of foreign body forceps and removed without any problem.  When I was doing that, I could see the orifice on the right side.  I could not see it on left.  So, I could not put a stent on the right side when I was doing the left side.  He has a large stone with big hydronephrosis on the right side.  I think he needs a stent; although, he does not want me to do a stone basket on the right side, so I thought I will go ahead and put a stent although do not have the permission to do that.  I will explain to him later.  Now, I have the opportunity to put a stent, so I got an open-end catheter, put it into the orifice and then a Glidewire was passed up into the renal pelvis without any difficulty under the fluoroscopic control.  Once the guidewire was there, I advanced the open-end catheter over the guidewire, and took the Glidewire out.  Injected in 1 or 2 mL of Hypaque to outline the collecting system.  I was getting hydronephrotic drip, and it outlined the collecting system, which was massively dilated.  So, I decided to leave a double-J stent in Glidewire.  Through the open- ended catheter, I introduced  Bentson guidewire and removed the open- ended catheter over the guidewire.  I introduced 5-French 24 cm double-J stent without string.  It was advanced into the renal pelvis, but it went a little bit more into the ureter, so I had to do a ureteroscopy to get the stent out into the bladder, and once it was done, string has been removed.  All the instruments were removed.  The patient and I can see a nice loop in the bladder and also on the renal pelvis.  All the instruments were removed.  The patient left the operating room in satisfactory condition.     Ky Barban, M.D.     MIJ/MEDQ  D:  01/25/2013  T:  01/26/2013  Job:  [id number]

## 2013-01-28 ENCOUNTER — Encounter (HOSPITAL_COMMUNITY): Payer: Self-pay | Admitting: Urology

## 2013-02-01 ENCOUNTER — Encounter (HOSPITAL_COMMUNITY)
Admission: RE | Admit: 2013-02-01 | Discharge: 2013-02-01 | Disposition: A | Discharge status: Home / Self Care | Payer: PRIVATE HEALTH INSURANCE | Source: Ambulatory Visit | Attending: Urology | Admitting: Urology

## 2013-02-01 ENCOUNTER — Encounter (HOSPITAL_COMMUNITY): Payer: Self-pay

## 2013-02-01 NOTE — Patient Instructions (Signed)
[name]  02/01/2013   Your procedure is scheduled on:  02/02/2013  Report to [facility] at  1030  AM.  Call this number if you have problems the morning of surgery: [phone]   Remember:   Do not eat food or drink liquids after midnight.   Take these medicines the morning of surgery with A SIP OF WATER: exforge, percocet   Do not wear jewelry, make-up or nail polish.  Do not wear lotions, powders, or perfumes.   Do not shave 48 hours prior to surgery. Men may shave face and neck.  Do not bring valuables to the hospital.  [facility] is not responsible  for any belongings or valuables.               Contacts, dentures or bridgework may not be worn into surgery.  Leave suitcase in the car. After surgery it may be brought to your room.  For patients admitted to the hospital, discharge time is determined by your treatment team.               Patients discharged the day of surgery will not be allowed to drive home.  Name and phone number of your driver: family  Special Instructions: N/A   Please read over the following fact sheets that you were given: Pain Booklet and Care and Recovery After Surgery Lithotripsy for Kidney Stones WHAT ARE KIDNEY STONES? The kidneys filter blood for chemicals the body cannot use. These waste chemicals are eliminated in the urine. They are removed from the body. Under some conditions, these chemicals may become concentrated. When this happens, they form crystals in the urine. When these crystals build up and stick together, stones may form. When these stones block the flow of urine through the urinary tract, they may cause severe pain. The urinary tract is very sensitive to blockage and stretching by the stone. WHAT IS LITHOTRIPSY? Lithotripsy is a treatment that can sometimes help eliminate kidney stones and pain faster. A form of lithotripsy, also known as ESWL (extracorporeal shock wave lithotripsy), is a nonsurgical procedure that  helps your body rid itself of the kidney stone with a minimum amount of pain. EWSL is a method of crushing a kidney stone with shock waves. These shock waves pass through your body. They cause the kidney stones to crumble while still in the urinary tract. It is then easier for the smaller pieces of stone to pass in the urine. Lithotripsy usually takes about an hour. It is done in a hospital, a lithotripsy center, or a mobile unit. It usually does not require an overnight stay. Your caregiver will instruct you on preparation for the procedure. Your caregiver will tell you what to expect afterward. LET YOUR CAREGIVER KNOW ABOUT:  Allergies.  Medicines taken including herbs, eye drops, over the counter medicines (including aspirin, aleve, or motrin for treatment of inflammatory conditions) and creams.  Use of steroids (by mouth or creams).  Previous problems with anesthetics or novocaine.  Possibility of pregnancy, if this applies.  History of blood clots (thrombophlebitis).  History of bleeding or blood problems.  Previous surgery.  Other health problems. RISKS AND COMPLICATIONS Complications of lithotripsy are uncommon, but include the following:  Infection.  Bleeding of the kidney.  Bruising of the kidney or skin.  Obstruction of the ureter (the passageway from the kidney to the bladder).  Failure of the stone to fragment (break apart). PROCEDURE A stent (flexible tube with  holes) may be placed in your ureter. The ureter is the tube that transports the urine from the kidneys to the bladder. Your caregiver may place a stent before the procedure. This will help keep urine flowing from the kidney if the fragments of the stone block the ureter. You may receive an intravenous (IV) line to give you fluids and medicines. These medicines may help you relax or make you sleep. During the procedure, you will lie comfortably on a fluid-filled cushion or in a warm-water bath. After an x-ray or  ultrasound locates your stone, shock waves are aimed at the stone. If you are awake, you may feel a tapping sensation (feeling) as the shock waves pass through your body. If large stone particles remain after treatment, a second procedure may be necessary at a later date. For comfort during the test:  Relax as much as possible.  Try to remain still as much as possible.  Try to follow instructions to speed up the test.  Let your caregiver know if you are uncomfortable, anxious, or in pain. AFTER THE PROCEDURE  After surgery, you will be taken to the recovery area. A nurse will watch and check your progress. Once you're awake, stable, and taking fluids well, you will be allowed to go home as long as there are no problems. You may be prescribed antibiotics (medicines that kill germs) to help prevent infection. You may also be prescribed pain medicine if needed. In a week or two, your doctor may remove your stent, if you have one. Your caregiver will check to see whether or not stone particles remain. PASSING THE STONE It may take anywhere from a day to several weeks for the stone particles to leave your body. During this time, drink at least 8 to 12 eight ounce glasses of water every day. It is normal for your urine to be cloudy or slightly bloody for a few weeks following this procedure. You may even see small pieces of stone in your urine. A slight fever and some pain are also normal. Your caregiver may ask you to strain your urine to collect some stone particles for chemical analysis. If you find particles while straining the urine, save them. Analysis tells you and the caregiver what the stone is made of. Knowing this may help prevent future stones. PREVENTING FUTURE STONES  Drink about 8 to 12, eight-ounce glasses of water every day.  Follow the diet your caregiver recommends.  Take your prescribed medicine.  See your caregiver regularly for checkups. SEEK IMMEDIATE MEDICAL CARE IF:  You  develop an oral temperature above 102 F (38.9 C), or as your caregiver suggests.  Your pain is not relieved by medicine.  You develop nausea (feeling sick to your stomach) and vomiting.  You develop heavy bleeding.  You have difficulty urinating. Document Released: 02/08/2000 Document Revised: 05/05/2011 Document Reviewed: 08/26/2012 [facility] Patient Information 2014 [place], [place]. PATIENT INSTRUCTIONS POST-ANESTHESIA  IMMEDIATELY FOLLOWING SURGERY:  Do not drive or operate machinery for the first twenty four hours after surgery.  Do not make any important decisions for twenty four hours after surgery or while taking narcotic pain medications or sedatives.  If you develop intractable nausea and vomiting or a severe headache please notify your doctor immediately.  FOLLOW-UP:  Please make an appointment with your surgeon as instructed. You do not need to follow up with anesthesia unless specifically instructed to do so.  WOUND CARE INSTRUCTIONS (if applicable):  Keep a dry clean dressing on the anesthesia/puncture  wound site if there is drainage.  Once the wound has quit draining you may leave it open to air.  Generally you should leave the bandage intact for twenty four hours unless there is drainage.  If the epidural site drains for more than 36-48 hours please call the anesthesia department.  QUESTIONS?:  Please feel free to call your physician or the hospital operator if you have any questions, and they will be happy to assist you.

## 2013-02-02 ENCOUNTER — Encounter (HOSPITAL_COMMUNITY): Payer: Self-pay | Admitting: *Deleted

## 2013-02-02 ENCOUNTER — Encounter (HOSPITAL_COMMUNITY)
Admission: RE | Disposition: A | Discharge status: Home / Self Care | Payer: Self-pay | Source: Ambulatory Visit | Attending: Urology

## 2013-02-02 ENCOUNTER — Ambulatory Visit (HOSPITAL_COMMUNITY): Payer: PRIVATE HEALTH INSURANCE

## 2013-02-02 ENCOUNTER — Ambulatory Visit (HOSPITAL_COMMUNITY)
Admission: RE | Admit: 2013-02-02 | Discharge: 2013-02-02 | Disposition: A | Discharge status: Home / Self Care | Payer: PRIVATE HEALTH INSURANCE | Source: Ambulatory Visit | Attending: Urology | Admitting: Urology

## 2013-02-02 DIAGNOSIS — Z01812 Encounter for preprocedural laboratory examination: Secondary | ICD-10-CM | POA: Insufficient documentation

## 2013-02-02 DIAGNOSIS — E119 Type 2 diabetes mellitus without complications: Secondary | ICD-10-CM | POA: Insufficient documentation

## 2013-02-02 DIAGNOSIS — N201 Calculus of ureter: Secondary | ICD-10-CM | POA: Insufficient documentation

## 2013-02-02 HISTORY — PX: EXTRACORPOREAL SHOCK WAVE LITHOTRIPSY: SHX1557

## 2013-02-02 LAB — GLUCOSE, CAPILLARY: Glucose-Capillary: 96 mg/dL (ref 70–99)

## 2013-02-02 SURGERY — LITHOTRIPSY, ESWL
Anesthesia: Moderate Sedation | Laterality: Right

## 2013-02-02 MED ORDER — SODIUM CHLORIDE 0.9 % IV SOLN
INTRAVENOUS | Status: DC
  Administered 2013-02-02: 11:00:00 via INTRAVENOUS

## 2013-02-02 MED ORDER — DIAZEPAM 5 MG PO TABS
10.0000 mg | ORAL_TABLET | Freq: Once | ORAL | Status: AC
  Administered 2013-02-02: 10 mg via ORAL

## 2013-02-02 MED ORDER — DIPHENHYDRAMINE HCL 25 MG PO CAPS
ORAL_CAPSULE | ORAL | Status: AC
  Filled 2013-02-02: qty 1

## 2013-02-02 MED ORDER — DIPHENHYDRAMINE HCL 25 MG PO CAPS
25.0000 mg | ORAL_CAPSULE | Freq: Once | ORAL | Status: AC
  Administered 2013-02-02: 25 mg via ORAL

## 2013-02-02 MED ORDER — DIAZEPAM 5 MG PO TABS
ORAL_TABLET | ORAL | Status: AC
  Filled 2013-02-02: qty 2

## 2013-02-02 MED ORDER — DIAZEPAM 5 MG/ML IJ SOLN: 10.0000 mg | Freq: Once | INTRAMUSCULAR | Status: DC

## 2013-02-02 SURGICAL SUPPLY — 3 items
CLOTH BEACON ORANGE TIMEOUT ST (SAFETY) IMPLANT
GOWN STRL REIN XL XLG (GOWN DISPOSABLE) IMPLANT
TOWEL OR 17X26 4PK STRL BLUE (TOWEL DISPOSABLE) IMPLANT

## 2013-02-07 ENCOUNTER — Encounter (HOSPITAL_COMMUNITY): Payer: Self-pay | Admitting: Urology

## 2013-02-09 ENCOUNTER — Ambulatory Visit (HOSPITAL_COMMUNITY)
Admission: RE | Admit: 2013-02-09 | Discharge: 2013-02-09 | Disposition: A | Discharge status: Home / Self Care | Payer: PRIVATE HEALTH INSURANCE | Source: Ambulatory Visit | Attending: Urology | Admitting: Urology

## 2013-02-09 ENCOUNTER — Other Ambulatory Visit (HOSPITAL_COMMUNITY): Payer: Self-pay | Admitting: Urology

## 2013-02-09 DIAGNOSIS — N200 Calculus of kidney: Secondary | ICD-10-CM

## 2013-02-09 DIAGNOSIS — N201 Calculus of ureter: Secondary | ICD-10-CM | POA: Insufficient documentation

## 2013-04-27 ENCOUNTER — Ambulatory Visit (HOSPITAL_COMMUNITY)
Admission: RE | Admit: 2013-04-27 | Discharge: 2013-04-27 | Disposition: A | Discharge status: Home / Self Care | Payer: PRIVATE HEALTH INSURANCE | Source: Ambulatory Visit | Attending: Urology | Admitting: Urology

## 2013-04-27 ENCOUNTER — Other Ambulatory Visit (HOSPITAL_COMMUNITY): Payer: Self-pay | Admitting: Urology

## 2013-04-27 DIAGNOSIS — Z09 Encounter for follow-up examination after completed treatment for conditions other than malignant neoplasm: Secondary | ICD-10-CM | POA: Insufficient documentation

## 2013-04-27 DIAGNOSIS — N201 Calculus of ureter: Secondary | ICD-10-CM

## 2013-04-27 DIAGNOSIS — N200 Calculus of kidney: Secondary | ICD-10-CM | POA: Insufficient documentation

## 2013-05-13 ENCOUNTER — Encounter (HOSPITAL_COMMUNITY): Payer: Self-pay | Admitting: Pharmacy Technician

## 2013-05-19 ENCOUNTER — Other Ambulatory Visit: Payer: Self-pay

## 2013-05-19 ENCOUNTER — Encounter (HOSPITAL_COMMUNITY): Payer: Self-pay

## 2013-05-19 ENCOUNTER — Encounter (HOSPITAL_COMMUNITY)
Admission: RE | Admit: 2013-05-19 | Discharge: 2013-05-19 | Disposition: A | Discharge status: Home / Self Care | Payer: PRIVATE HEALTH INSURANCE | Source: Ambulatory Visit | Attending: Urology | Admitting: Urology

## 2013-05-19 DIAGNOSIS — Z01810 Encounter for preprocedural cardiovascular examination: Secondary | ICD-10-CM | POA: Insufficient documentation

## 2013-05-19 DIAGNOSIS — Z01812 Encounter for preprocedural laboratory examination: Secondary | ICD-10-CM | POA: Insufficient documentation

## 2013-05-19 HISTORY — DX: Unspecified hearing loss, unspecified ear: H91.90

## 2013-05-19 LAB — BASIC METABOLIC PANEL
BUN: 14 mg/dL (ref 6–23)
CALCIUM: 9.6 mg/dL (ref 8.4–10.5)
CO2: 29 mEq/L (ref 19–32)
Chloride: 106 mEq/L (ref 96–112)
Creatinine, Ser: 1.17 mg/dL (ref 0.50–1.35)
GFR calc Af Amer: 72 mL/min — ABNORMAL LOW (ref >90)
GFR, EST NON AFRICAN AMERICAN: 62 mL/min — AB (ref >90)
Glucose, Bld: 108 mg/dL — ABNORMAL HIGH (ref 70–99)
POTASSIUM: 3.8 meq/L (ref 3.7–5.3)
SODIUM: 144 meq/L (ref 137–147)

## 2013-05-19 NOTE — Pre-Procedure Instructions (Signed)
Patient does not have computer.

## 2013-05-19 NOTE — Patient Instructions (Addendum)
Your procedure is scheduled on:  05/25/13  Report to [facility] at 10:30 AM  Call this number if you have problems the morning of surgery: [phone]   Remember:   Do not eat food or drink liquids after midnight.   Take these medicines the morning of surgery with A SIP OF WATER: Exforge   Do not wear jewelry, make-up or nail polish.  Do not wear lotions, powders, or perfumes.   Do not shave 48 hours prior to surgery. Men may shave face and neck.  Do not bring valuables to the hospital.  [facility] is not responsible for any belongings or valuables.               Contacts, dentures or bridgework may not be worn into surgery.  Leave suitcase in the car. After surgery it may be brought to your room.  For patients admitted to the hospital, discharge time is determined by your treatment team.               Patients discharged the day of surgery will not be allowed to drive home.    Please read over the following fact sheets that you were given: Anesthesia Post-op Instructions and Care and Recovery After Surgery     Lithotripsy for Kidney Stones Lithotripsy is a treatment that can sometimes help eliminate kidney stones and pain that they cause. A form of lithotripsy, also known as extracorporeal shock wave lithotripsy, is a nonsurgical procedure that helps your body rid itself of the kidney stone when it is too big to pass on its own. Extracorporeal shock wave lithotripsy is a method of crushing a kidney stone with shock waves. These shock waves pass through your body and are focused on your stone. They cause the kidney stones to crumble while still in the urinary tract. It is then easier for the smaller pieces of stone to pass in the urine. Lithotripsy usually takes about an hour. It is done in a hospital, a lithotripsy center, or a mobile unit. It usually does not require an overnight stay. Your health care provider will instruct you on preparation for the procedure. Your health care provider  will tell you what to expect afterward. LET [facility] CARE PROVIDER KNOW ABOUT:  Any allergies you have.  All medicines you are taking, including vitamins, herbs, eye drops, creams, and over-the-counter medicines.  Previous problems you or members of your family have had with the use of anesthetics.  Any blood disorders you have.  Previous surgeries you have had.  Medical conditions you have. RISKS AND COMPLICATIONS Generally, lithotripsy for kidney stones is a safe procedure. However, as with any procedure, complications can occur. Possible complications include:  Infection.  Bleeding of the kidney.  Bruising of the kidney or skin.  Obstruction of the ureter.  Failure of the stone to fragment. BEFORE THE PROCEDURE  Do not eat or drink for 6 8 hours prior to the procedure. You may, however, take the medications with a sip of water that your physician instructs you to take  Do not take aspirin or aspirin-containing products for 7 days prior to your procedure  Do not take nonsteroidal anti-inflammatory products for 7 days prior to your procedure PROCEDURE A stent (flexible tube with holes) may be placed in your ureter. The ureter is the tube that transports the urine from the kidneys to the bladder. Your health care provider may place a stent before the procedure. This will help keep urine flowing from the  kidney if the fragments of the stone block the ureter. You may have an IV tube placed in one of your veins to give you fluids and medicines. These medicines may help you relax or make you sleep. During the procedure, you will lie comfortably on a fluid-filled cushion or in a warm-water bath. After an X-ray or ultrasound exam to locate your stone, shock waves are aimed at the stone. If you are awake, you may feel a tapping sensation as the shock waves pass through your body. If large stone particles remain after treatment, a second procedure may be necessary at a later date. For  comfort during the test:  Relax as much as possible.  Try to remain still as much as possible.  Try to follow instructions to speed up the test.  Let your health care provider know if you are uncomfortable, anxious, or in pain. AFTER THE PROCEDURE  After surgery, you will be taken to the recovery area. A nurse will watch and check your progress. Once you're awake, stable, and taking fluids well, you will be allowed to go home as long as there are no problems. You will also be allowed to pass your urine before discharge.You may be given antibiotics to help prevent infection. You may also be prescribed pain medicine if needed. In a week or two, your health care provider may remove your stent, if you have one. You may first have an X-ray exam to check on how successful the fragmentation of your stone has been and how much of the stone has passed. Your health care provider will check to see whether or not stone particles remain. SEEK IMMEDIATE MEDICAL CARE IF:  You develop a fever or shaking chills.  Your pain is not relieved by medicine.  You feel sick to your stomach (nauseated) and you vomit.  You develop heavy bleeding.  You have difficulty urinating.  You start to pass your stent from your penis. Document Released: 02/08/2000 Document Revised: 12/01/2012 Document Reviewed: 08/26/2012 [facility] Patient Information 2014 [place].    PATIENT INSTRUCTIONS POST-ANESTHESIA  IMMEDIATELY FOLLOWING SURGERY:  Do not drive or operate machinery for the first twenty four hours after surgery.  Do not make any important decisions for twenty four hours after surgery or while taking narcotic pain medications or sedatives.  If you develop intractable nausea and vomiting or a severe headache please notify your doctor immediately.  FOLLOW-UP:  Please make an appointment with your surgeon as instructed. You do not need to follow up with anesthesia unless specifically instructed to do  so.  WOUND CARE INSTRUCTIONS (if applicable):  Keep a dry clean dressing on the anesthesia/puncture wound site if there is drainage.  Once the wound has quit draining you may leave it open to air.  Generally you should leave the bandage intact for twenty four hours unless there is drainage.  If the epidural site drains for more than 36-48 hours please call the anesthesia department.  QUESTIONS?:  Please feel free to call your physician or the hospital operator if you have any questions, and they will be happy to assist you.

## 2013-05-25 ENCOUNTER — Encounter (HOSPITAL_COMMUNITY): Payer: Self-pay | Admitting: *Deleted

## 2013-05-25 ENCOUNTER — Ambulatory Visit (HOSPITAL_COMMUNITY)
Admission: RE | Admit: 2013-05-25 | Discharge: 2013-05-25 | Disposition: A | Discharge status: Home / Self Care | Payer: PRIVATE HEALTH INSURANCE | Source: Ambulatory Visit | Attending: Urology | Admitting: Urology

## 2013-05-25 ENCOUNTER — Ambulatory Visit (HOSPITAL_COMMUNITY): Payer: PRIVATE HEALTH INSURANCE

## 2013-05-25 ENCOUNTER — Encounter (HOSPITAL_COMMUNITY)
Admission: RE | Disposition: A | Discharge status: Home / Self Care | Payer: Self-pay | Source: Ambulatory Visit | Attending: Urology

## 2013-05-25 DIAGNOSIS — Z7982 Long term (current) use of aspirin: Secondary | ICD-10-CM | POA: Insufficient documentation

## 2013-05-25 DIAGNOSIS — E119 Type 2 diabetes mellitus without complications: Secondary | ICD-10-CM | POA: Diagnosis not present

## 2013-05-25 DIAGNOSIS — Z79899 Other long term (current) drug therapy: Secondary | ICD-10-CM | POA: Insufficient documentation

## 2013-05-25 DIAGNOSIS — N200 Calculus of kidney: Secondary | ICD-10-CM | POA: Insufficient documentation

## 2013-05-25 DIAGNOSIS — I10 Essential (primary) hypertension: Secondary | ICD-10-CM | POA: Diagnosis not present

## 2013-05-25 HISTORY — PX: EXTRACORPOREAL SHOCK WAVE LITHOTRIPSY: SHX1557

## 2013-05-25 LAB — GLUCOSE, CAPILLARY: Glucose-Capillary: 88 mg/dL (ref 70–99)

## 2013-05-25 SURGERY — LITHOTRIPSY, ESWL
Anesthesia: Moderate Sedation | Laterality: Right

## 2013-05-25 MED ORDER — SODIUM CHLORIDE 0.9 % IV SOLN
INTRAVENOUS | Status: DC
  Administered 2013-05-25: 1000 mL via INTRAVENOUS

## 2013-05-25 MED ORDER — DIPHENHYDRAMINE HCL 25 MG PO CAPS
ORAL_CAPSULE | ORAL | Status: AC
Start: 2013-05-25 — End: 2013-05-25
  Filled 2013-05-25: qty 1

## 2013-05-25 MED ORDER — DIAZEPAM 5 MG PO TABS
ORAL_TABLET | ORAL | Status: AC
  Filled 2013-05-25: qty 2

## 2013-05-25 MED ORDER — DIPHENHYDRAMINE HCL 25 MG PO CAPS
25.0000 mg | ORAL_CAPSULE | Freq: Once | ORAL | Status: AC
  Administered 2013-05-25: 25 mg via ORAL

## 2013-05-25 MED ORDER — DIAZEPAM 5 MG PO TABS
10.0000 mg | ORAL_TABLET | Freq: Once | ORAL | Status: AC
Start: 2013-05-25 — End: 2013-05-25
  Administered 2013-05-25: 10 mg via ORAL

## 2013-05-25 SURGICAL SUPPLY — 3 items
CLOTH BEACON ORANGE TIMEOUT ST (SAFETY) IMPLANT
GOWN STRL REUS W/TWL LRG LVL3 (GOWN DISPOSABLE) IMPLANT
TOWEL OR 17X26 4PK STRL BLUE (TOWEL DISPOSABLE) IMPLANT

## 2013-05-25 NOTE — Discharge Instructions (Signed)
Report to [facility] for [facility]UB x-ray on 06/01/13 at 9:00 AM. Follow-up with Dr. Michela Pitcher on 06/01/13 at 10:00 AM.   Lithotripsy, Care After Refer to this sheet in the next few weeks. These instructions provide you with information on caring for yourself after your procedure. Your health care provider may also give you more specific instructions. Your treatment has been planned according to current medical practices, but problems sometimes occur. Call your health care provider if you have any problems or questions after your procedure. WHAT TO EXPECT AFTER THE PROCEDURE   Your urine may have a red tinge for a few days after treatment. Blood loss is usually minimal.  You may have soreness in the back or flank area. This usually goes away after a few days. The procedure can cause blotches or bruises on the back where the pressure wave enters the skin. These marks usually cause only minimal discomfort and should disappear in a short time.  Stone fragments should begin to pass within 24 hours of treatment. However, a delayed passage is not unusual.  You may have pain, discomfort, and feel sick to your stomach (nauseated) when the crushed fragments of stone are passed down the tube from the kidney to the bladder. Stone fragments can pass soon after the procedure and may last for up to 4 8 weeks.  A small number of patients may have severe pain when stone fragments are not able to pass, which leads to an obstruction.  If your stone is greater than 1 inch (2.5 cm) in diameter or if you have multiple stones that have a combined diameter greater than 1 inch (2.5 cm), you may require more than one treatment.  If you had a stent placed prior to your procedure, you may experience some discomfort, especially during urination. You may experience the pain or discomfort in your flank or back, or you may experience a sharp pain or discomfort at the base of your penis or in your lower abdomen. The discomfort  usually lasts only a few minutes after urinating. HOME CARE INSTRUCTIONS   Rest at home until you feel your energy improving.  Only take over-the-counter or prescription medicines for pain, discomfort, or fever as directed by your health care provider. Depending on the type of lithotripsy, you may need to take antibiotics and anti-inflammatory medicines for a few days.  Drink enough water and fluids to keep your urine clear or pale yellow. This helps "flush" your kidneys. It helps pass any remaining pieces of stone and prevents stones from coming back.  Most people can resume daily activities within 1 2 days after standard lithotripsy. It can take longer to recover from laser and percutaneous lithotripsy.  If the stones are in your urinary system, you may be asked to strain your urine at home to look for stones. Any stones that are found can be sent to a medical lab for examination.  Visit your health care provider for a follow-up appointment in a few weeks. Your doctor may remove your stent if you have one. Your health care provider will also check to see whether stone particles still remain. SEEK MEDICAL CARE IF:   Your pain is not relieved by medicine.  You have a lasting nauseous feeling.  You feel there is too much blood in the urine.  You develop persistent problems with frequent or painful urination that does not at least partially improve after 2 days following the procedure.  You have a congested cough.  You  feel lightheaded.  You develop a rash or any other signs that might suggest an allergic problem.  You develop any reaction or side effects to your medicine(s). SEEK IMMEDIATE MEDICAL CARE IF:   You experience severe back or flank pain or both.  You see nothing but blood when you urinate.  You cannot pass any urine at all.  You have a fever or shaking chills.  You develop shortness of breath, difficulty breathing, or chest pain.  You develop vomiting that will  not stop after 6 8 hours.  You have a fainting episode. Document Released: 03/02/2007 Document Revised: 12/01/2012 Document Reviewed: 08/26/2012 [facility] Patient Information 2014 [place], [place].

## 2013-05-26 ENCOUNTER — Encounter (HOSPITAL_COMMUNITY): Payer: Self-pay | Admitting: Urology

## 2013-06-01 ENCOUNTER — Ambulatory Visit (HOSPITAL_COMMUNITY)
Admission: RE | Admit: 2013-06-01 | Discharge: 2013-06-01 | Disposition: A | Discharge status: Home / Self Care | Payer: PRIVATE HEALTH INSURANCE | Source: Ambulatory Visit | Attending: Urology | Admitting: Urology

## 2013-06-01 ENCOUNTER — Other Ambulatory Visit (HOSPITAL_COMMUNITY): Payer: Self-pay | Admitting: Urology

## 2013-06-01 DIAGNOSIS — N200 Calculus of kidney: Secondary | ICD-10-CM | POA: Insufficient documentation

## 2013-06-01 DIAGNOSIS — N201 Calculus of ureter: Secondary | ICD-10-CM

## 2013-11-17 ENCOUNTER — Emergency Department: Payer: Self-pay | Admitting: Student

## 2013-11-17 LAB — CBC
HCT: 40.4 % (ref 40.0–52.0)
HGB: 13.1 g/dL (ref 13.0–18.0)
MCH: 26.3 pg (ref 26.0–34.0)
MCHC: 32.4 g/dL (ref 32.0–36.0)
MCV: 81 fL (ref 80–100)
PLATELETS: 225 10*3/uL (ref 150–440)
RBC: 4.98 10*6/uL (ref 4.40–5.90)
RDW: 16.8 % — AB (ref 11.5–14.5)
WBC: 6.9 10*3/uL (ref 3.8–10.6)

## 2013-11-17 LAB — URINALYSIS, COMPLETE
Bacteria: NONE SEEN
Bilirubin,UR: NEGATIVE
Glucose,UR: NEGATIVE mg/dL (ref 0–75)
Hyaline Cast: 4
Ketone: NEGATIVE
LEUKOCYTE ESTERASE: NEGATIVE
NITRITE: NEGATIVE
Ph: 6 (ref 4.5–8.0)
Protein: 30
RBC,UR: 143 /HPF (ref 0–5)
Specific Gravity: 1.019 (ref 1.003–1.030)
Squamous Epithelial: <1
WBC UR: 3 /HPF (ref 0–5)

## 2013-11-17 LAB — DRUG SCREEN, URINE
Amphetamines, Ur Screen: NEGATIVE (ref ?–1000)
BENZODIAZEPINE, UR SCRN: POSITIVE (ref ?–200)
Barbiturates, Ur Screen: NEGATIVE (ref ?–200)
CANNABINOID 50 NG, UR ~~LOC~~: NEGATIVE (ref ?–50)
Cocaine Metabolite,Ur ~~LOC~~: NEGATIVE (ref ?–300)
MDMA (Ecstasy)Ur Screen: NEGATIVE (ref ?–500)
Methadone, Ur Screen: NEGATIVE (ref ?–300)
OPIATE, UR SCREEN: NEGATIVE (ref ?–300)
Phencyclidine (PCP) Ur S: NEGATIVE (ref ?–25)
TRICYCLIC, UR SCREEN: NEGATIVE (ref ?–1000)

## 2013-11-17 LAB — COMPREHENSIVE METABOLIC PANEL
ALK PHOS: 66 U/L
ALT: 13 U/L — AB
ANION GAP: 7 (ref 7–16)
AST: 21 U/L (ref 15–37)
Albumin: 3.5 g/dL (ref 3.4–5.0)
BILIRUBIN TOTAL: 1.2 mg/dL — AB (ref 0.2–1.0)
BUN: 11 mg/dL (ref 7–18)
CO2: 27 mmol/L (ref 21–32)
CREATININE: 1.37 mg/dL — AB (ref 0.60–1.30)
Calcium, Total: 9.2 mg/dL (ref 8.5–10.1)
Chloride: 106 mmol/L (ref 98–107)
GFR CALC NON AF AMER: 55 — AB
Glucose: 138 mg/dL — ABNORMAL HIGH (ref 65–99)
OSMOLALITY: 281 (ref 275–301)
Potassium: 3.7 mmol/L (ref 3.5–5.1)
SODIUM: 140 mmol/L (ref 136–145)
Total Protein: 7 g/dL (ref 6.4–8.2)

## 2013-11-17 LAB — ACETAMINOPHEN LEVEL

## 2013-11-17 LAB — TSH: Thyroid Stimulating Horm: 3.86 u[IU]/mL

## 2013-11-17 LAB — ETHANOL: Ethanol: <3 mg/dL

## 2013-11-17 LAB — SALICYLATE LEVEL: Salicylates, Serum: <1.7 mg/dL

## 2014-02-16 ENCOUNTER — Encounter (HOSPITAL_COMMUNITY): Payer: Self-pay

## 2014-02-16 ENCOUNTER — Emergency Department (HOSPITAL_COMMUNITY)
Admission: EM | Admit: 2014-02-16 | Discharge: 2014-02-16 | Disposition: A | Discharge status: Home / Self Care | Payer: PRIVATE HEALTH INSURANCE | Attending: Emergency Medicine | Admitting: Emergency Medicine

## 2014-02-16 DIAGNOSIS — F419 Anxiety disorder, unspecified: Secondary | ICD-10-CM | POA: Insufficient documentation

## 2014-02-16 DIAGNOSIS — Z79899 Other long term (current) drug therapy: Secondary | ICD-10-CM | POA: Insufficient documentation

## 2014-02-16 DIAGNOSIS — F4329 Adjustment disorder with other symptoms: Secondary | ICD-10-CM | POA: Insufficient documentation

## 2014-02-16 DIAGNOSIS — Z87442 Personal history of urinary calculi: Secondary | ICD-10-CM | POA: Insufficient documentation

## 2014-02-16 HISTORY — DX: Calculus of kidney: N20.0

## 2014-02-16 MED ORDER — HYDROXYZINE HCL 25 MG PO TABS: 25.0000 mg | ORAL_TABLET | Freq: Two times a day (BID) | ORAL | Status: DC | PRN

## 2014-02-16 NOTE — BH Assessment (Signed)
TTS writer faxed outpatient and crisis resources to [facility] to be given to patient prior to d/c.   Shaune Pollack, MS, [place] Assessment Counselor
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
## 2014-02-16 NOTE — BH Assessment (Signed)
Consulted  with Dr.McManus EDP prior to assessing patient.  Shaune Pollack, MS, [place] Assessment Counselor

## 2014-02-16 NOTE — Discharge Instructions (Signed)
°Emergency Department Resource Guide °1) Find a Doctor and Pay Out of Pocket °Although you won't have to find out who is covered by your insurance plan, it is a good idea to ask around and get recommendations. You will then need to call the office and see if the doctor you have chosen will accept you as a new patient and what types of options they offer for patients who are self-pay. Some doctors offer discounts or will set up payment plans for their patients who do not have insurance, but you will need to ask so you aren't surprised when you get to your appointment. ° °2) Contact Your Local Health Department °Not all health departments have doctors that can see patients for sick visits, but many do, so it is worth a call to see if yours does. If you don't know where your local health department is, you can check in your phone book. The CDC also has a tool to help you locate your state's health department, and many state websites also have listings of all of their local health departments. ° °3) Find a Walk-in Clinic °If your illness is not likely to be very severe or complicated, you may want to try a walk in clinic. These are popping up all over the country in pharmacies, drugstores, and shopping centers. They're usually staffed by nurse practitioners or physician assistants that have been trained to treat common illnesses and complaints. They're usually fairly quick and inexpensive. However, if you have serious medical issues or chronic medical problems, these are probably not your best option. ° °No Primary Care Doctor: °- Call [facility] at  832-8000 - they can help you locate a primary care doctor that  accepts your insurance, provides certain services, etc. °- Physician Referral Service- 1-800-533-3463 ° °Chronic Pain Problems: °Organization         Address  Phone   Notes  °[facility]  (336) 297-2271 Patients need to be referred by their primary care doctor.  ° °Medication  Assistance: °Organization         Address  Phone   Notes  °[place] [facility] [address] °[place], [place] [address] (336) 641-8030 --Must be a resident of [place] °-- Must have NO insurance coverage whatsoever (no Medicaid/ Medicare, etc.) °-- The pt. MUST have a primary care doctor that directs their care regularly and follows them in the community °  °[facility]  (866) 331-1348   °[employer]  (888) 892-1162   ° °Agencies that provide inexpensive medical care: °Organization         Address  Phone   Notes  °[facility]  (336) 832-8035   °[facility]    (336) 832-7272   °[facility] [address] °[place], [place] [address] (336) 832-4777   °[facility] [address], °[place] (336) 271-4999   °[facility]    (336) 373-0678   °[facility]    (336) 272-1050   °[facility] ° [address], [place] Phone:  (336) 832-4444, Fax:  (336) 832-4440 Hours of Operation:  9 am - 6 pm, M-F.  Also accepts Medicaid/Medicare and self-pay.  ° [facility] ° [address], [place] Phone: (336) 832-3150, Fax: (336) [phone]. Hours of Operation:  8:30 am - 5:30 pm, M-F.  Also accepts Medicaid and self-pay.  °[facility] [place] [address], [place] Phone: (336) 878-6027   °[facility] [address], [place], [place] (336)723-1848, Ext. 123 Mondays & Thursdays: 7-9 AM.  First 15 patients are seen on a first come, first serve basis. °  ° °Medicaid-accepting [place] Providers: ° °Organization         Address  Phone   Notes  °[facility] [address], [place] (336) 641-2100 Also accepts self-pay patients.  °[facility] [address], [place] ° (336) 856-9996   °[facility] [address], [place]  (336) 288-8857   °[facility] [address] [place] [address], [place] (336) 299-7000   °Veita Bland [address], [place]  ° (336) 373-1557 Only accepts [place] Access Medicaid patients after they have their name applied to their card.  ° °Self-Pay (no insurance) in [place]: ° °Organization         Address  Phone   Notes  °Sickle Cell Patients, [facility] [address], [place] (336) 832-1970   °[facility] [address], [place] (336) 832-4400   °[facility] ° [address], [place] (336) 992-4800   °[facility]/Dr. Osei-Bonsu ° [address] [place] [address], [place] or [address], [place] (336) [phone] Phone number for both [place] and [place] locations is the same.  °[facility] [address], [place] (336) 299-0000   °[facility] [address] [place] [address], [place] or [address] (336) 852-7530 °(336) 878-2260   °[facility] [address], [place] (336) 350-1642, phone; (336) 294-5005, fax Sees patients 1st and 3rd Saturday of every month.  Must not qualify for public or private insurance (i.e. Medicaid, Medicare, [place] Health Choice, Veterans' Benefits) • Household income should be no more than 200% of the poverty level •The clinic cannot treat you if you are pregnant or think you are pregnant • Sexually transmitted diseases are not treated at the clinic.  ° ° °Dental Care: °Organization         Address  Phone  Notes  °[place] [facility] [address], [place] (336) 641-6152 Accepts children up to age 21 who are enrolled in Medicaid or [place] Health Choice; pregnant women with a Medicaid card; and children who have applied for Medicaid or [place] Health Choice, but were declined, whose parents can pay a reduced fee at time of service.  °[place]  [facility] [place]  [address], [place] (336) 641-7733 Accepts children up to age 21 who are enrolled in Medicaid or [place] Health Choice; pregnant women with a Medicaid card; and children who have applied for Medicaid or [place] Health Choice, but were declined, whose parents can pay a reduced fee at time of service.  °[facility] ° [address], [place] (336) 641-4533 Patients are seen by appointment only. Walk-ins are not accepted. [facility] will see patients 18 years of age and older. °Monday - Tuesday (8am-5pm) °Most Wednesdays (8:30-5pm) °$30 per visit, cash only  °[facility] ° [address], [place] (336) 641-4533 Patients are seen by appointment only. Walk-ins are not accepted. [facility] will see patients 18 years of age and older. °One   Wednesday Evening (Monthly: Volunteer Based).  $30 per visit, cash only  °[facility]  (919) [phone] for adults; Children under age 4, call Graduate Pediatric Dentistry at ([phone]) [phone]. Children aged 4-14, please call ([phone]) [phone] to request a pediatric application. ° Dental services are provided in all areas of dental care including fillings, crowns and bridges, complete and partial dentures, implants, gum treatment, root canals, and extractions. Preventive care is also provided. Treatment is provided to both adults and children. °Patients are selected via a lottery and there is often a waiting list. °  °[facility] [address], °[place] ° (336) 763-8833 [email or web address] °  °[facility] [address], [place], [place] (336)723-1848, Ext. 123 Second and Fourth Thursday of each month, opens at 6:30 AM; Clinic ends at 9 AM.  Patients are seen on a first-come first-served basis, and a limited number are seen during each clinic.  ° °[facility] ° [address], [place], [place] (336) 723-7904    Eligibility Requirements °You must have lived in [place], or [place] counties for at least the last three months. °  You cannot be eligible for state or federal sponsored healthcare insurance, including Veterans Administration, Medicaid, or Medicare. °  You generally cannot be eligible for healthcare insurance through your employer.  °  How to apply: °Eligibility screenings are held every Tuesday and Wednesday afternoon from 1:00 pm until 4:00 pm. You do not need an appointment for the interview!  °[facility] [address], [place], [place] 336-631-2330   °[facility]  336-342-8273   °[facility]  336-703-3100   °[facility]  336-570-6415   ° °Behavioral Health Resources in the Community: °Intensive Outpatient Programs °Organization         Address  Phone  Notes  °[facility] [address], [place], [place] 336-878-6098   °[facility] [address], [place], [place] 336-832-9800   °[facility] [address], [place], [place] ° 336-882-2125   °[facility] [address],  °[place], [place] 1-800-853-5163 or 336-641-4981   °Substance Abuse Resources °Organization         Address  Phone  Notes  °[facility]  336-882-2125   °[facility]  336-784-9470   °[facility]  336-285-9073   °[facility]  336-845-3988   °Residential & Outpatient Substance Abuse Program  1-800-659-3381   °Psychological Services °Organization         Address  Phone  Notes  °[facility]  336- 832-9600   °[facility]  336- 378-7881   °[facility] [address], [place] 1-800-853-5163 or 336-641-4981   ° °Mobile Crisis Teams °Organization         Address  Phone  Notes  °[facility], Mobile Crisis Care Unit  1-877-626-1772   °Assertive °[facility] ° [address]  [place], [place] 336-834-9664   °Sharon DeEsch [address] °[place] 336-554-5454   ° °Self-Help/Support Groups °Organization         Address  Phone             Notes  °[facility] - variety of support groups  336- 373-1402 Call for more information  °Narcotics Anonymous (NA), [facility] [address], °[place]  2 meetings at this location  ° °  Residential Treatment Programs Organization         Address  Phone  Notes  [facility] [address],    [place]  1-850-783-9338   [facility]  [address] [place] [address], [place], [place]   [address] [place], [place] (252) [phone] Admissions: 8am-3pm M-F  [facility] [address],    [place], [place] [phone]   [facility] [address] [place], [place], [place]   [facility] [address]  [place], [place]   [facility] - Intensive Outpatient [address], [place], [place]   [facility] ([place] [place].,  [place], [place] 1-(818) 402-7867 or 408-066-5285   [facility] [address] [place], [place] Accepts Medicaid  [facility] [address] [place] [address],  [place] 1-(340)772-3775 Substance Abuse/Addiction Treatment   [facility] Organization         Address  Phone  Notes  [facility]  978-793-1918   Domenic Schwab, PhD [address] [place] [address] [place], [place]   [phone] or 205-109-9353   [facility] [place], [place] [phone]   [facility] [address] [place], [place] (616)[phone] Insurance/Medicaid/sponsorship through [facility] and Families [address]                                    [place], [place] [phone] [facility] [address], [place] [phone]    Dr. Adele Schilder  [phone]   [facility] Dept. 1) [address] [place] 2) [place] 3)  [address], [place] [phone] [phone]  [phone]   [place] ([phone] or [phone] (After Hours)      Take the new prescription as directed as needed for anxiety: caution this medication may cause drowsiness.  Call your regular medical doctor and the mental health resources given to you today to schedule a follow up appointment within the next 3 to 4 days.  Return to the Emergency Department immediately sooner if worsening.

## 2014-02-16 NOTE — BH Assessment (Signed)
Spoke with  Nursing secretary at [facility], she will set-up tele-psych machine for patient to be assessed by TTS.   Shaune Pollack, MS, [place] Assessment Counselor
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
## 2014-03-28 DIAGNOSIS — Z6826 Body mass index (BMI) 26.0-26.9, adult: Secondary | ICD-10-CM | POA: Diagnosis not present

## 2014-03-28 DIAGNOSIS — F4320 Adjustment disorder, unspecified: Secondary | ICD-10-CM | POA: Diagnosis not present

## 2014-03-28 DIAGNOSIS — E663 Overweight: Secondary | ICD-10-CM | POA: Diagnosis not present

## 2014-03-28 DIAGNOSIS — I10 Essential (primary) hypertension: Secondary | ICD-10-CM | POA: Diagnosis not present

## 2014-03-28 DIAGNOSIS — E785 Hyperlipidemia, unspecified: Secondary | ICD-10-CM | POA: Diagnosis not present

## 2014-06-09 DIAGNOSIS — Z6825 Body mass index (BMI) 25.0-25.9, adult: Secondary | ICD-10-CM | POA: Diagnosis not present

## 2014-06-09 DIAGNOSIS — E663 Overweight: Secondary | ICD-10-CM | POA: Diagnosis not present

## 2014-06-09 DIAGNOSIS — L982 Febrile neutrophilic dermatosis [Sweet]: Secondary | ICD-10-CM | POA: Diagnosis not present

## 2014-06-17 NOTE — Consult Note (Signed)
PATIENT NAME:  [name], [name] MR#:  [id number] DATE OF BIRTH:  24-Aug-1945  DATE OF CONSULTATION:  11/18/2013  REFERRING PHYSICIAN:   CONSULTING PHYSICIAN:  Gonzella Lex, MD  IDENTIFYING INFORMATION AND REASON FOR CONSULTATION: A 69 year old man who was brought here under involuntary petition after making statements at his primary care doctor's office about being ready to hurt himself or his brother. Consultation for appropriate disposition.   HISTORY OF PRESENT ILLNESS: Information obtained from the patient and the chart. The patient went to his regular doctor's office yesterday apparently and made some statements about how he was going to hurt his brother and possibly how he would kill himself. He did not actually harm anyone and did not actually make any attempt to kill himself. The patient's primary care doctor evidently wanted him to go see a psychiatrist and the patient panicked and tried to leave. At that point, law enforcement was involved and brought him to the hospital. The patient states that his nerves stay bad most of the time. They have been bad for about 6 months. He had previously been living with his elderly mother when his brother asked him to leave and go live independently. Since then, the patient stays nervous a lot. Does not sleep very well at night. He denies having any hallucinations. Denies suicidal or homicidal ideation. Says that he was just upset and running his mouth and that he had absolutely no intention of really trying to hurt anybody. He is not currently taking any psychiatric medicines or seeing anybody for any mental health treatment. He says he is stressed because he is living independently. His mother is 29 years old and he wants to see her more often, but he says his brother does not want him coming around because he thinks that he stirs up trouble. The patient gets upset because he is not able to see his mother as much as he would like. He has not been having any  psychotic symptoms. Physically he says he takes his medicine prescribed by his primary care doctor, and denies having any significant medical problems other than chronic kidney stones and a history of heart disease.   PAST PSYCHIATRIC HISTORY: Never been in psych hospital. He says he has never been on any medicine for his nerves. Never seen a mental health provider, never tried to kill himself. No history of violence.   SOCIAL HISTORY: The patient states to me several times that he is unable to read or write. 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 Has not worked in years, now he is on disability. He has an elderly mother who he worries about who lives about a minute walk away from his home. [place]. He has siblings who it sounds like at least occasionally check in on him.   PAST MEDICAL HISTORY: Says he has a lot of kidney stones. Also claims to have a history of heart disease. No acute medical complaints right now.   FAMILY HISTORY: He does not know of any family history of mental illness.   SUBSTANCE ABUSE HISTORY: Michela Pitcher he has not had a drink of alcohol in over 30 years. Denies any current substance abuse. Denies any use of other drugs.   CURRENT MEDICATIONS: Simvastatin 20 mg once a day, amlodipine valsartan combination 5 mg/160 mg once a day, Xanax 0.5 mg every 8 hours as needed, potassium chloride 20 mEq once a day.   ALLERGIES: No known drug  allergies.   REVIEW OF SYSTEMS: Mildly anxious. Not depressed. No suicidal or homicidal ideation. No hallucinations. Not feeling shaky. No pain. No malaise. Full 9-point physical review of systems is negative.   MENTAL STATUS EXAMINATION: Reasonably well groomed gentleman who looks his stated age, cooperative and pleasant during the interview. Good eye contact. Normal psychomotor activity. Speech normal rate, tone, and volume. Affect is mildly anxious but reactive and appropriate. Mood is stated as being  okay. Thoughts are lucid. No loosening of associations or delusions. Denies auditory or visual hallucinations. Denies suicidal or homicidal ideation. He can remember 2/3 objects at 3 minutes after getting 3/3 immediately. General fund of knowledge, low average. He is alert and oriented but he does get the year wrong saying it is 2016.   LABORATORY RESULTS: Drug screen positive for benzodiazepines, probably from the Xanax that he is taking, but is not really aware of. Glucose 138, creatinine elevated at 1.37, total bilirubin 1.2. Alcohol negative. TSH normal. CBC all normal. Urinalysis, positive red cells, does not look infected.   VITAL SIGNS: Current blood pressure is 161/89, respirations 18, pulse 65, temperature 98.9.   The patient is able to ambulate without difficulty. Use all extremities, does not appear in any distress. Cranial nerves symmetric.   ASSESSMENT: A 69 year old man who appears to probably be of limited cognitive capacity, although not necessarily mentally retarded. He is quite pleasant right now. No evidence that he actually tried to hurt himself or anyone else. He denies any real symptoms of depression. It does sound like he stays nervous much of the time and has some social problems. There is no indication I can see of really being acutely dangerous or of significant acute illness. The patient does not meet commitment criteria and does not need hospital level treatment.   TREATMENT PLAN: The patient will be discharged back home and we will give him a referral to see local mental health in his community. The patient is agreeable to the plan. Psychoeducation completed. Discussed the plan with ER doctor, discontinue IVC.   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   SECONDARY DIAGNOSES: AXIS I: No further.  AXIS II: Deferred.    ____________________________ Gonzella Lex, MD jtc:at D: 11/18/2013 16:39:47  ET T: 11/18/2013 16:53:49 ET JOB#: [id number]  cc: Gonzella Lex, MD, <Dictator> Gonzella Lex MD ELECTRONICALLY SIGNED 11/18/2013 23:14

## 2014-08-29 IMAGING — CR DG ABDOMEN 1V
2 series · 2 of 2 positions shown · non-contrast
Comparison: CT 12/13/2012.

CLINICAL DATA: Pre ESWL for kidney stone.

EXAM:
ABDOMEN - 1 VIEW

[view not recorded (1 of 2)]
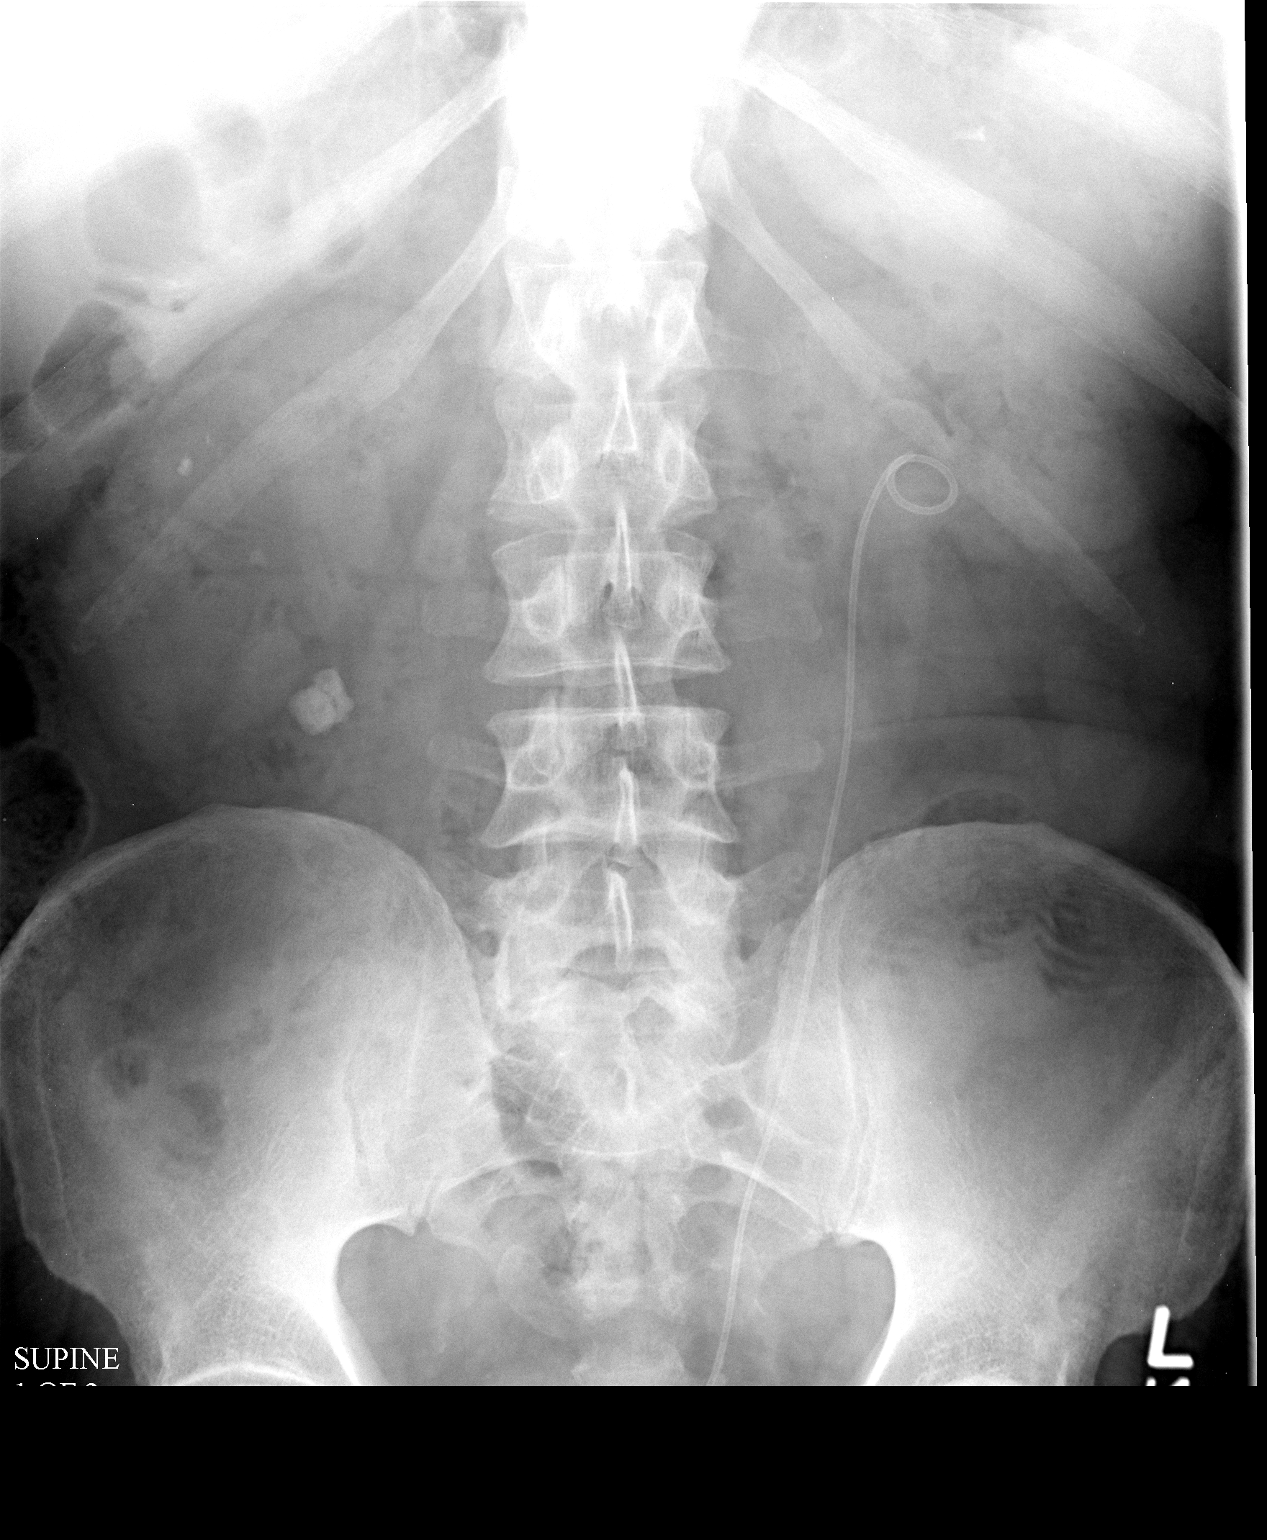

[view not recorded (2 of 2)]
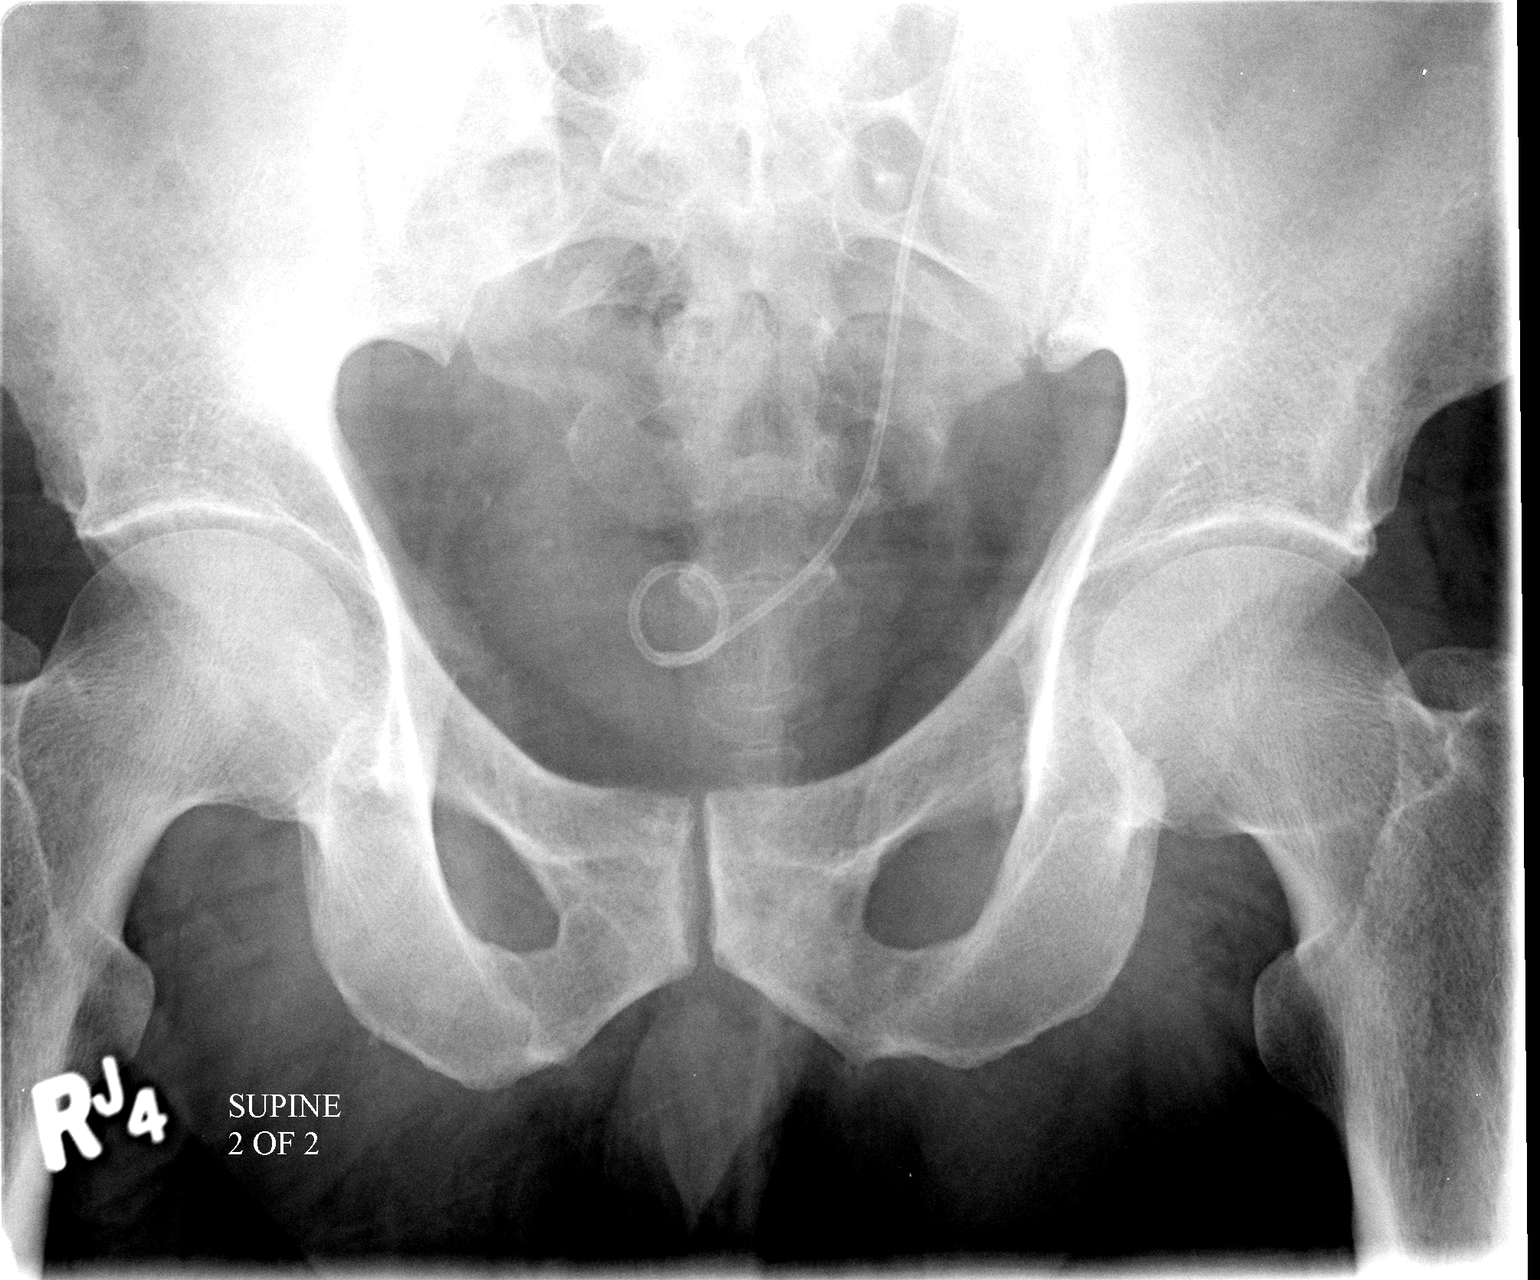

[2 of 2 positions shown; findings below may reference images not displayed]

FINDINGS: Left ureteral stent in place. 5 x 6 mm stone in the distal left
ureter unchanged from the CT. No other left renal calculi.

Several right renal calculi measuring 3-5 mm. Large stone a in the
proximal right ureter, similar to the prior CT.
IMPRESSION: 5 x 6 mm stone distal left ureter with left ureteral stent.

Multiple right renal calculi with a large stone in the proximal
right ureter.

## 2014-09-20 DIAGNOSIS — Z1389 Encounter for screening for other disorder: Secondary | ICD-10-CM | POA: Diagnosis not present

## 2014-09-20 DIAGNOSIS — R21 Rash and other nonspecific skin eruption: Secondary | ICD-10-CM | POA: Diagnosis not present

## 2014-09-20 DIAGNOSIS — Z6824 Body mass index (BMI) 24.0-24.9, adult: Secondary | ICD-10-CM | POA: Diagnosis not present

## 2014-09-26 IMAGING — CR DG ABDOMEN 1V
1 series · 1 of 1 positions shown · non-contrast
Comparison: 01/19/2013

CLINICAL DATA: Right ureteral calculus, lithotripsy

EXAM:
ABDOMEN - 1 VIEW

[view not recorded]
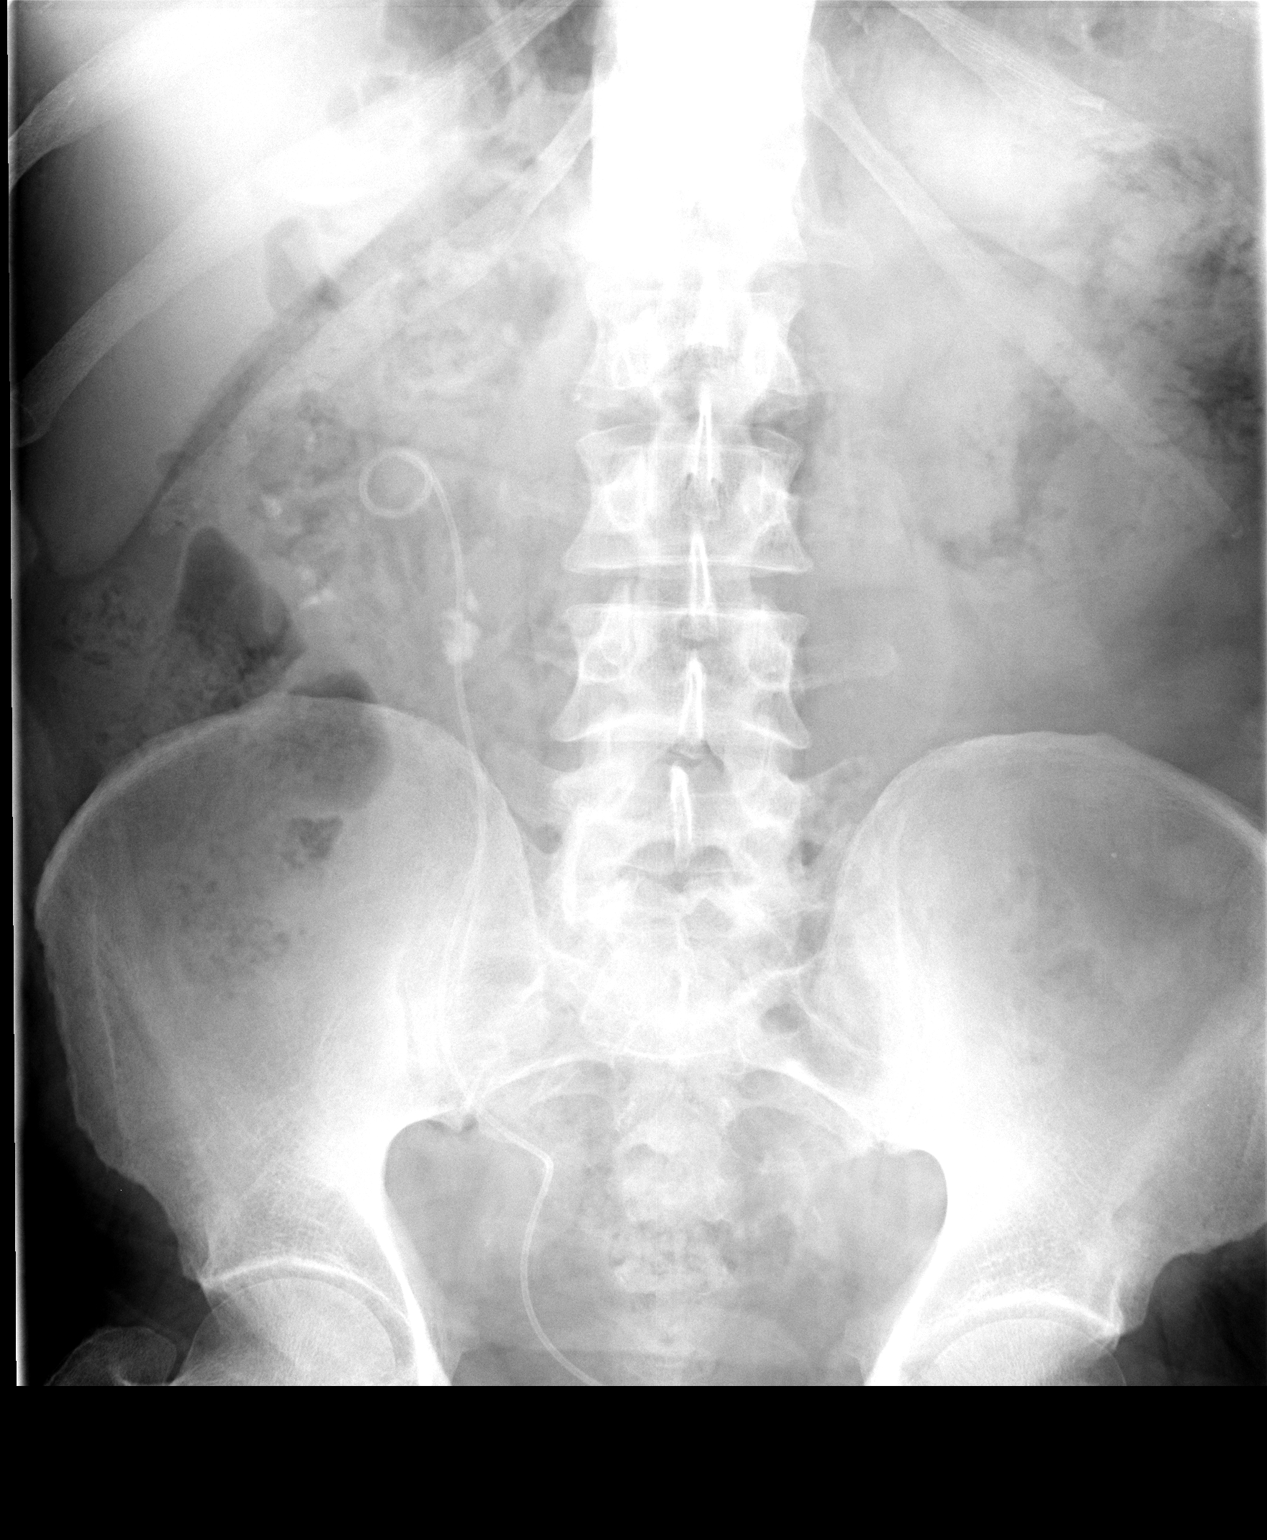

[1 of 1 positions shown; findings below may reference images not displayed]

FINDINGS: Interval placement of a right ureteral stent.

Numerous right renal calculi with a large calculus at the right
ureteral pelvic junction 15 x 11 mm.

Additional small calcification at right UPJ 7 x 3 mm.

No ureteral calculi identified.

No left-sided urinary tract calcification seen.

Previously identified left ureteral stent has been removed.

Bowel gas pattern normal.

Osseous structures unremarkable.
IMPRESSION: Numerous right renal calculi with large calculus at right
ureteropelvic junction.

## 2014-10-20 DIAGNOSIS — N183 Chronic kidney disease, stage 3 (moderate): Secondary | ICD-10-CM | POA: Diagnosis not present

## 2014-10-30 ENCOUNTER — Encounter (HOSPITAL_COMMUNITY): Payer: Self-pay | Admitting: Urology

## 2014-11-29 DIAGNOSIS — I10 Essential (primary) hypertension: Secondary | ICD-10-CM | POA: Diagnosis not present

## 2014-11-29 DIAGNOSIS — Z6823 Body mass index (BMI) 23.0-23.9, adult: Secondary | ICD-10-CM | POA: Diagnosis not present

## 2014-11-29 DIAGNOSIS — R4183 Borderline intellectual functioning: Secondary | ICD-10-CM | POA: Diagnosis not present

## 2014-11-29 DIAGNOSIS — Z1389 Encounter for screening for other disorder: Secondary | ICD-10-CM | POA: Diagnosis not present

## 2014-11-29 DIAGNOSIS — M19019 Primary osteoarthritis, unspecified shoulder: Secondary | ICD-10-CM | POA: Diagnosis not present

## 2014-11-29 DIAGNOSIS — E782 Mixed hyperlipidemia: Secondary | ICD-10-CM | POA: Diagnosis not present

## 2014-11-29 DIAGNOSIS — Z23 Encounter for immunization: Secondary | ICD-10-CM | POA: Diagnosis not present

## 2015-01-23 IMAGING — CR DG ABDOMEN 1V
2 series · 2 of 2 positions shown · non-contrast
Comparison: 05/25/2013

CLINICAL DATA: Right ureteral calculus

EXAM:
ABDOMEN - 1 VIEW

[view not recorded (1 of 2)]
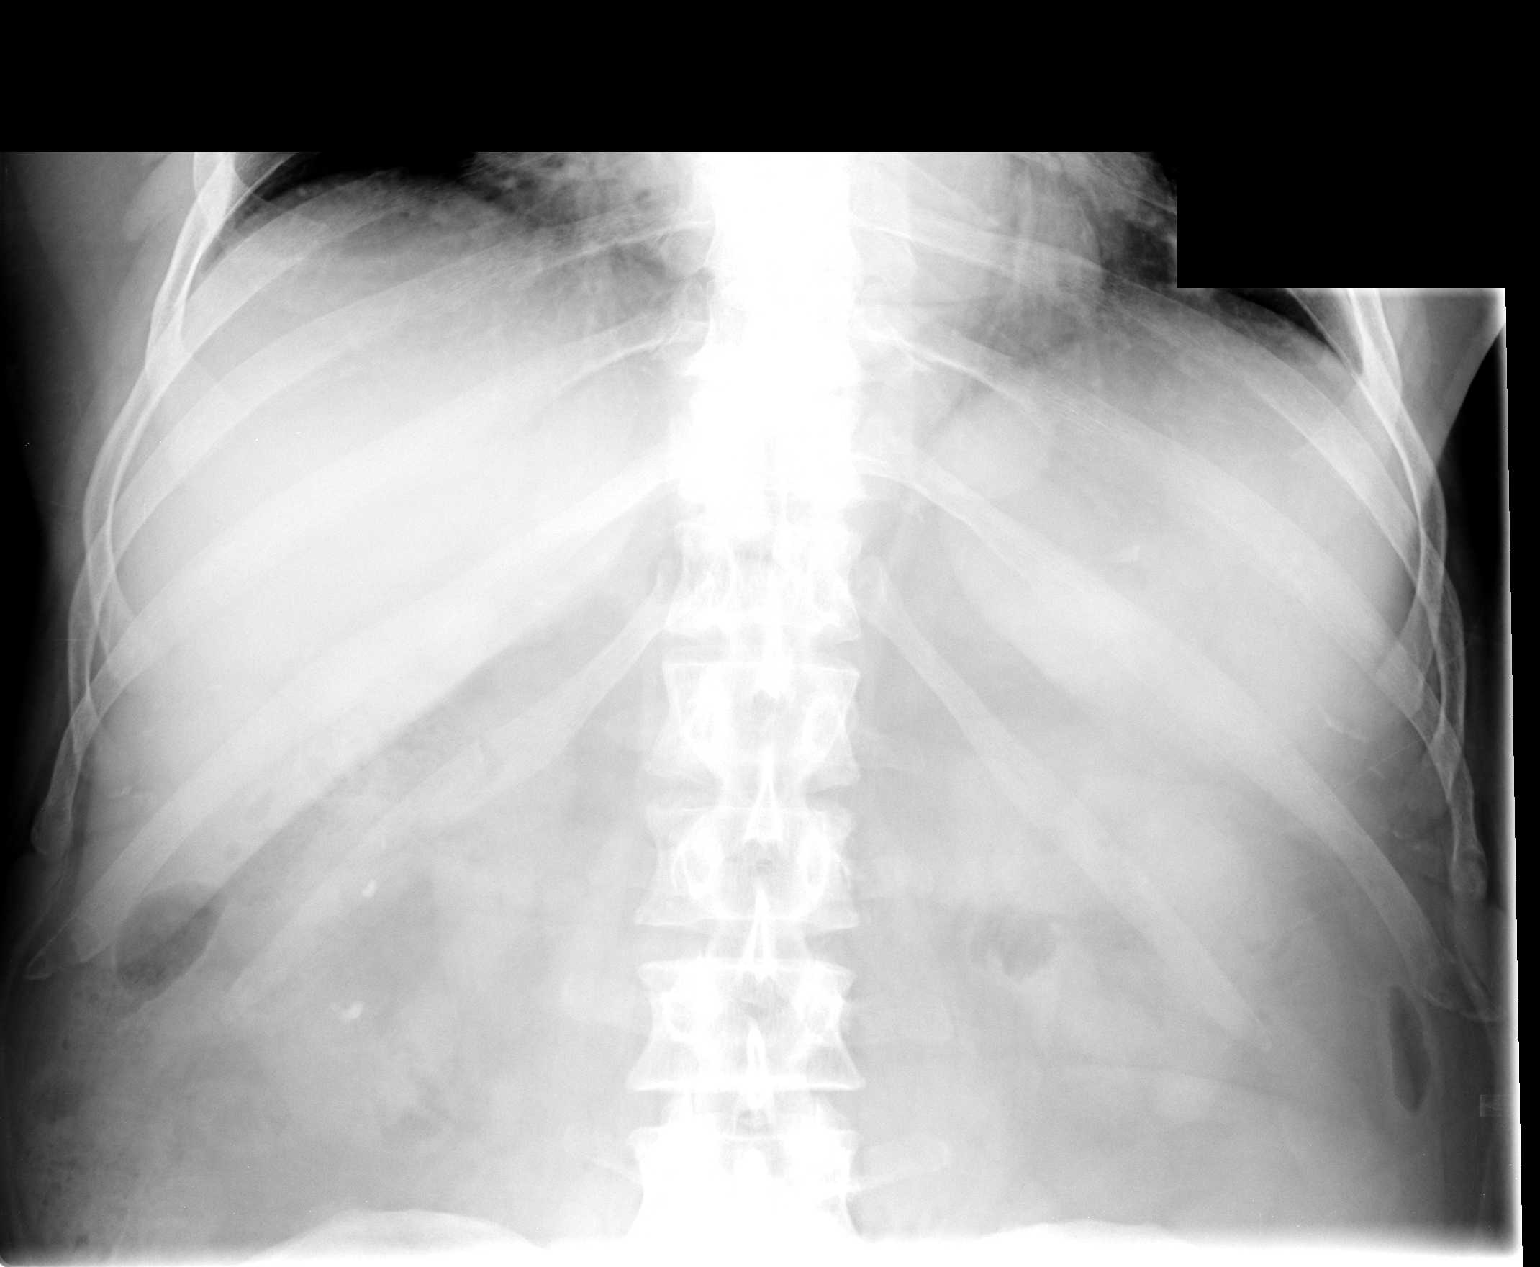

[view not recorded (2 of 2)]
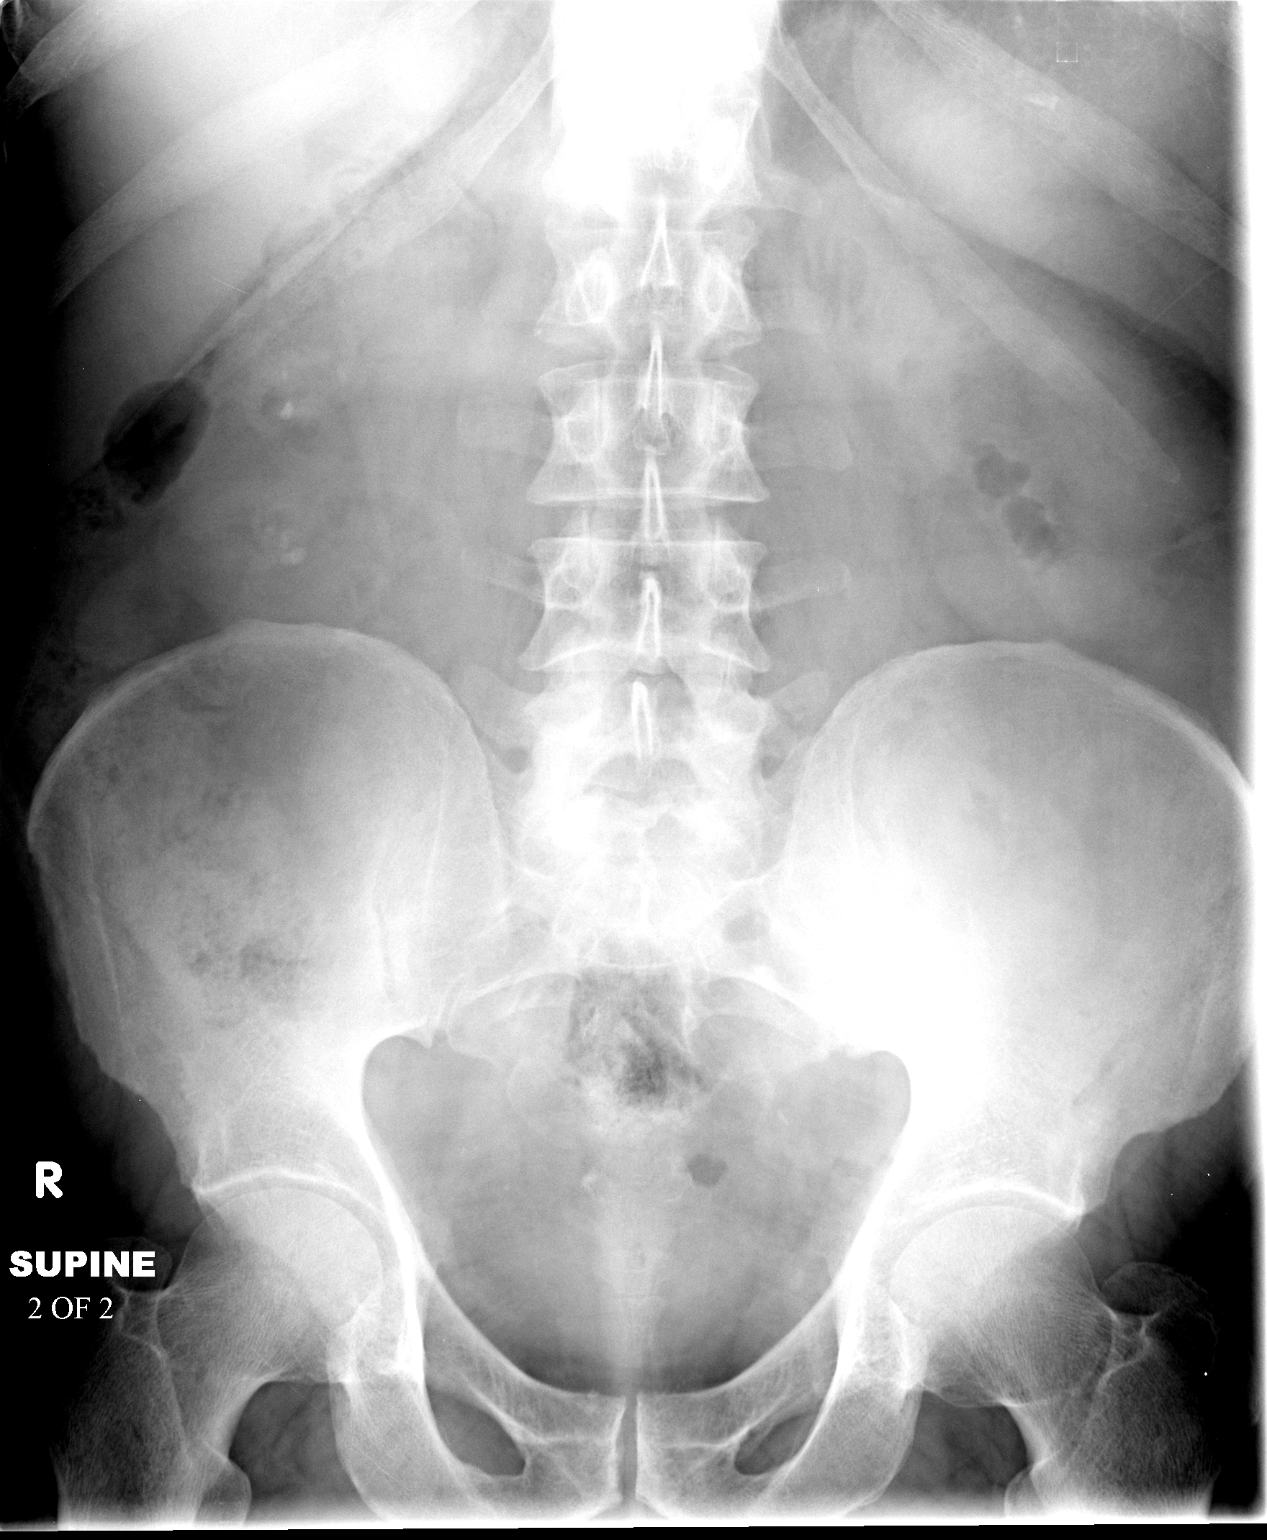

[2 of 2 positions shown; findings below may reference images not displayed]

FINDINGS: Multiple right renal calculi, largest measuring 5 mm and 7 mm in
diameter.

No definite ureteral calcification.

Few tiny pelvic phleboliths stable.

No left-sided urinary tract calcification.

Bowel gas pattern normal.

No acute osseous findings.
IMPRESSION: Multiple nonobstructing right renal calculi.

No definite ureteral calculus visualized.

## 2015-02-28 DIAGNOSIS — R21 Rash and other nonspecific skin eruption: Secondary | ICD-10-CM | POA: Diagnosis not present

## 2015-02-28 DIAGNOSIS — Z1389 Encounter for screening for other disorder: Secondary | ICD-10-CM | POA: Diagnosis not present

## 2015-02-28 DIAGNOSIS — Z6824 Body mass index (BMI) 24.0-24.9, adult: Secondary | ICD-10-CM | POA: Diagnosis not present

## 2015-04-27 DIAGNOSIS — J209 Acute bronchitis, unspecified: Secondary | ICD-10-CM | POA: Diagnosis not present

## 2015-04-27 DIAGNOSIS — Z1389 Encounter for screening for other disorder: Secondary | ICD-10-CM | POA: Diagnosis not present

## 2015-04-27 DIAGNOSIS — M25519 Pain in unspecified shoulder: Secondary | ICD-10-CM | POA: Diagnosis not present

## 2015-04-27 DIAGNOSIS — Z6823 Body mass index (BMI) 23.0-23.9, adult: Secondary | ICD-10-CM | POA: Diagnosis not present

## 2015-07-23 ENCOUNTER — Emergency Department (HOSPITAL_COMMUNITY): Payer: Medicare Other

## 2015-07-23 ENCOUNTER — Encounter (HOSPITAL_COMMUNITY): Payer: Self-pay | Admitting: Emergency Medicine

## 2015-07-23 ENCOUNTER — Emergency Department (HOSPITAL_COMMUNITY)
Admission: EM | Admit: 2015-07-23 | Discharge: 2015-07-23 | Disposition: A | Discharge status: Home / Self Care | Payer: Medicare Other | Attending: Emergency Medicine | Admitting: Emergency Medicine

## 2015-07-23 DIAGNOSIS — I10 Essential (primary) hypertension: Secondary | ICD-10-CM | POA: Diagnosis not present

## 2015-07-23 DIAGNOSIS — R109 Unspecified abdominal pain: Secondary | ICD-10-CM

## 2015-07-23 DIAGNOSIS — Z79899 Other long term (current) drug therapy: Secondary | ICD-10-CM | POA: Insufficient documentation

## 2015-07-23 DIAGNOSIS — N200 Calculus of kidney: Secondary | ICD-10-CM | POA: Diagnosis not present

## 2015-07-23 DIAGNOSIS — E119 Type 2 diabetes mellitus without complications: Secondary | ICD-10-CM | POA: Insufficient documentation

## 2015-07-23 DIAGNOSIS — Z7982 Long term (current) use of aspirin: Secondary | ICD-10-CM | POA: Diagnosis not present

## 2015-07-23 DIAGNOSIS — R1032 Left lower quadrant pain: Secondary | ICD-10-CM | POA: Insufficient documentation

## 2015-07-23 LAB — COMPREHENSIVE METABOLIC PANEL
ALT: 9 U/L — AB (ref 17–63)
AST: 14 U/L — ABNORMAL LOW (ref 15–41)
Albumin: 4.1 g/dL (ref 3.5–5.0)
Alkaline Phosphatase: 48 U/L (ref 38–126)
Anion gap: 7 (ref 5–15)
BUN: 14 mg/dL (ref 6–20)
CHLORIDE: 103 mmol/L (ref 101–111)
CO2: 27 mmol/L (ref 22–32)
CREATININE: 1.18 mg/dL (ref 0.61–1.24)
Calcium: 9 mg/dL (ref 8.9–10.3)
GFR calc non Af Amer: 60 mL/min — ABNORMAL LOW (ref >60)
Glucose, Bld: 89 mg/dL (ref 65–99)
POTASSIUM: 3.4 mmol/L — AB (ref 3.5–5.1)
SODIUM: 137 mmol/L (ref 135–145)
Total Bilirubin: 1.6 mg/dL — ABNORMAL HIGH (ref 0.3–1.2)
Total Protein: 6.8 g/dL (ref 6.5–8.1)

## 2015-07-23 LAB — CBC WITH DIFFERENTIAL/PLATELET
BASOS ABS: 0 10*3/uL (ref 0.0–0.1)
Basophils Relative: 0 %
EOS PCT: 0 %
Eosinophils Absolute: 0 10*3/uL (ref 0.0–0.7)
HEMATOCRIT: 41.8 % (ref 39.0–52.0)
Hemoglobin: 14 g/dL (ref 13.0–17.0)
LYMPHS ABS: 1 10*3/uL (ref 0.7–4.0)
LYMPHS PCT: 18 %
MCH: 30.4 pg (ref 26.0–34.0)
MCHC: 33.5 g/dL (ref 30.0–36.0)
MCV: 90.9 fL (ref 78.0–100.0)
Monocytes Absolute: 0.4 10*3/uL (ref 0.1–1.0)
Monocytes Relative: 6 %
NEUTROS ABS: 4.4 10*3/uL (ref 1.7–7.7)
Neutrophils Relative %: 76 %
PLATELETS: 161 10*3/uL (ref 150–400)
RBC: 4.6 MIL/uL (ref 4.22–5.81)
RDW: 14.2 % (ref 11.5–15.5)
WBC: 5.8 10*3/uL (ref 4.0–10.5)

## 2015-07-23 LAB — URINE MICROSCOPIC-ADD ON: BACTERIA UA: NONE SEEN

## 2015-07-23 LAB — URINALYSIS, ROUTINE W REFLEX MICROSCOPIC
Bilirubin Urine: NEGATIVE
GLUCOSE, UA: NEGATIVE mg/dL
Ketones, ur: NEGATIVE mg/dL
Nitrite: NEGATIVE
Protein, ur: NEGATIVE mg/dL
pH: 6 (ref 5.0–8.0)

## 2015-07-23 LAB — LIPASE, BLOOD: LIPASE: 26 U/L (ref 11–51)

## 2015-07-23 NOTE — Discharge Instructions (Signed)
Take your usual prescriptions as previously directed.  Apply moist heat or ice to the area(s) of discomfort, for 15 minutes at a time, several times per day for the next few days.  Do not fall asleep on a heating or ice pack.  Call your regular medical doctor tomorrow to schedule a follow up appointment in the next 2 days.  Return to the Emergency Department immediately if worsening. ° °

## 2015-07-23 NOTE — ED Provider Notes (Signed)
CSN: [id number]     Arrival date & time 07/23/15  1246 History   First MD Initiated Contact with Patient 07/23/15 1545     Chief Complaint  Patient presents with  . Flank Pain      HPI Pt was seen at 1600. Per pt, c/o sudden onset and persistence of waxing and waning left sided flank "pain" that began this afternoon PTA.  Pt describes the pain as "like my last kidney stone," and radiating into the left side of his abd.  Has been associated with nausea and "some loose stool."  Denies testicular pain/swelling, no dysuria/hematuria, no abd pain, no diarrhea, no black or blood in stools, no CP/SOB, no fevers, no rash.     Past Medical History  Diagnosis Date  . Hypertension   . Diabetes mellitus   . HOH (hard of hearing)   . Recurrent kidney stones   . Kidney stone    Past Surgical History  Procedure Laterality Date  . Lithotripsy      X 2  . Colonoscopy  07/15/2011    Procedure: COLONOSCOPY;  Surgeon: Jamesetta So, MD;  Location: [facility];  Service: Gastroenterology;  Laterality: N/A;  . Cystoscopy w/ ureteral stent placement Left 12/28/2012    Procedure: CYSTOSCOPY WITH RETROGRADE PYELOGRAM/LEFT URETERAL STENT PLACEMENT;  Surgeon: Marissa Nestle, MD;  Location: [facility];  Service: Urology;  Laterality: Left;  . Stone extraction with basket Left 12/28/2012    Procedure: STONE EXTRACTION WITH BASKET;  Surgeon: Marissa Nestle, MD;  Location: [facility];  Service: Urology;  Laterality: Left;  . Holmium laser application Left [id number]    Procedure: HOLMIUM LASER APPLICATION;  Surgeon: Marissa Nestle, MD;  Location: [facility];  Service: Urology;  Laterality: Left;  . Extracorporeal shock wave lithotripsy Right 01/05/2013    Procedure: EXTRACORPOREAL SHOCK WAVE LITHOTRIPSY (ESWL) RIGHT URETERAL CALCULUS;  Surgeon: Marissa Nestle, MD;  Location: [facility];  Service: Urology;  Laterality: Right;  . Cystoscopy w/ ureteral stent removal Left 01/25/2013    Procedure: CYSTOSCOPY WITH LEFT  URETERALSTENT REMOVAL;  Surgeon: Marissa Nestle, MD;  Location: [facility];  Service: Urology;  Laterality: Left;  . Cystoscopy w/ retrogrades Right 01/25/2013    Procedure: CYSTOSCOPY WITH RIGHT RETROGRADE PYELOGRAM, BALLOON DILATION RIGHT URETER AND RIGHT URETERAL STENT PLACEMENT;  Surgeon: Marissa Nestle, MD;  Location: [facility];  Service: Urology;  Laterality: Right;  . Extracorporeal shock wave lithotripsy Right 02/02/2013    Procedure: EXTRACORPOREAL SHOCK WAVE LITHOTRIPSY (ESWL) RIGHT URETERAL CALCULUS;  Surgeon: Marissa Nestle, MD;  Location: [facility];  Service: Urology;  Laterality: Right;  . Extracorporeal shock wave lithotripsy Right 05/25/2013    Procedure: EXTRACORPOREAL SHOCK WAVE LITHOTRIPSY (ESWL) RIGHT URETERAL CALCULUS;  Surgeon: Marissa Nestle, MD;  Location: [facility];  Service: Urology;  Laterality: Right;  . Lithotripsy     Family History  Problem Relation Age of Onset  . Colon cancer Paternal Uncle   . Stroke Mother   . Heart attack Father    Social History  Substance Use Topics  . Smoking status: Never Smoker   . Smokeless tobacco: Former Systems developer    Types: Snuff, Chew  . Alcohol Use: No    Review of Systems ROS: Statement: All systems negative except as marked or noted in the HPI; Constitutional: Negative for fever and chills. ; ; Eyes: Negative for eye pain, redness and discharge. ; ; ENMT: Negative for ear pain, hoarseness, nasal congestion, sinus pressure and  sore throat. ; ; Cardiovascular: Negative for chest pain, palpitations, diaphoresis, dyspnea and peripheral edema. ; ; Respiratory: Negative for cough, wheezing and stridor. ; ; Gastrointestinal: +nausea. Negative for vomiting, diarrhea, abdominal pain, blood in stool, hematemesis, jaundice and rectal bleeding. . ; ; Genitourinary: +flank pain. Negative for dysuria and hematuria. ; ; Genital:  No penile drainage or rash, no testicular pain or swelling, no scrotal rash or swelling. ;; Musculoskeletal: Negative for  back pain and neck pain. Negative for swelling and trauma.; ; Skin: Negative for pruritus, rash, abrasions, blisters, bruising and skin lesion.; ; Neuro: Negative for headache, lightheadedness and neck stiffness. Negative for weakness, altered level of consciousness, altered mental status, extremity weakness, paresthesias, involuntary movement, seizure and syncope.      Allergies  Review of patient's allergies indicates no known allergies.  Home Medications   Prior to Admission medications   Medication Sig Start Date End Date Taking? Authorizing Provider  amLODipine-valsartan (EXFORGE) 5-160 MG per tablet Take 1 tablet by mouth daily.    Yes Historical Provider, MD  aspirin EC 81 MG tablet Take 81 mg by mouth daily.   Yes Historical Provider, MD  potassium chloride SA (K-DUR,KLOR-CON) 20 MEQ tablet Take 20 mEq by mouth daily. 01/24/14  Yes Historical Provider, MD  PROAIR HFA 108 (90 Base) MCG/ACT inhaler INHALE TWO PUFFS BY MOUTH EVERY 4 TO 6 HOURS AS NEEDED FOR SHORTNESS OF BREATH 04/27/15  Yes Historical Provider, MD   BP 148/81 mmHg  Pulse 63  Temp(Src) 99.3 F (37.4 C) (Temporal)  Resp 18  Ht 5\' 8"  (1.727 m)  Wt 161 lb (73.029 kg)  BMI 24.49 kg/m2  SpO2 97% Physical Exam  1605: Physical examination:  Nursing notes reviewed; Vital signs and O2 SAT reviewed;  Constitutional: Well developed, Well nourished, Well hydrated, In no acute distress; Head:  Normocephalic, atraumatic; Eyes: EOMI, PERRL, No scleral icterus; ENMT: Mouth and pharynx normal, Mucous membranes moist; Neck: Supple, Full range of motion, No lymphadenopathy; Cardiovascular: Regular rate and rhythm, No gallop; Respiratory: Breath sounds clear & equal bilaterally, No wheezes.  Speaking full sentences with ease, Normal respiratory effort/excursion; Chest: Nontender, Movement normal; Abdomen: Soft, Nontender, Nondistended, Normal bowel sounds; Genitourinary: No CVA tenderness; Spine:  No midline CS, TS, LS tenderness.;;  Extremities: Pulses normal, No tenderness, No edema, No calf edema or asymmetry.; Neuro: AA&Ox3, Major CN grossly intact.  Speech clear. No gross focal motor or sensory deficits in extremities. Climbs on and off stretcher easily by himself. Gait steady.; Skin: Color normal, Warm, Dry.   ED Course  Procedures (including critical care time) Labs Review   Imaging Review  I have personally reviewed and evaluated these images and lab results as part of my medical decision-making.   EKG Interpretation None      MDM  MDM Reviewed: previous chart, nursing note and vitals Reviewed previous: labs Interpretation: labs and CT scan      Results for orders placed or performed during the hospital encounter of 07/23/15  Urinalysis, Routine w reflex microscopic- may I&O cath if menses  Result Value Ref Range   Color, Urine YELLOW YELLOW   APPearance CLEAR CLEAR   Specific Gravity, Urine <1.005 (L) 1.005 - 1.030   pH 6.0 5.0 - 8.0   Glucose, UA NEGATIVE NEGATIVE mg/dL   Hgb urine dipstick TRACE (A) NEGATIVE   Bilirubin Urine NEGATIVE NEGATIVE   Ketones, ur NEGATIVE NEGATIVE mg/dL   Protein, ur NEGATIVE NEGATIVE mg/dL   Nitrite NEGATIVE NEGATIVE   Leukocytes,  UA SMALL (A) NEGATIVE  Urine microscopic-add on  Result Value Ref Range   Squamous Epithelial / LPF 0-5 (A) NONE SEEN   WBC, UA 0-5 0 - 5 WBC/hpf   RBC / HPF 0-5 0 - 5 RBC/hpf   Bacteria, UA NONE SEEN NONE SEEN  Comprehensive metabolic panel  Result Value Ref Range   Sodium 137 135 - 145 mmol/L   Potassium 3.4 (L) 3.5 - 5.1 mmol/L   Chloride 103 101 - 111 mmol/L   CO2 27 22 - 32 mmol/L   Glucose, Bld 89 65 - 99 mg/dL   BUN 14 6 - 20 mg/dL   Creatinine, Ser 1.18 0.61 - 1.24 mg/dL   Calcium 9.0 8.9 - 10.3 mg/dL   Total Protein 6.8 6.5 - 8.1 g/dL   Albumin 4.1 3.5 - 5.0 g/dL   AST 14 (L) 15 - 41 U/L   ALT 9 (L) 17 - 63 U/L   Alkaline Phosphatase 48 38 - 126 U/L   Total Bilirubin 1.6 (H) 0.3 - 1.2 mg/dL   GFR calc non Af  Amer 60 (L) >60 mL/min   GFR calc Af Amer >60 >60 mL/min   Anion gap 7 5 - 15  Lipase, blood  Result Value Ref Range   Lipase 26 11 - 51 U/L  CBC with Differential  Result Value Ref Range   WBC 5.8 4.0 - 10.5 K/uL   RBC 4.60 4.22 - 5.81 MIL/uL   Hemoglobin 14.0 13.0 - 17.0 g/dL   HCT 41.8 39.0 - 52.0 %   MCV 90.9 78.0 - 100.0 fL   MCH 30.4 26.0 - 34.0 pg   MCHC 33.5 30.0 - 36.0 g/dL   RDW 14.2 11.5 - 15.5 %   Platelets 161 150 - 400 K/uL   Neutrophils Relative % 76 %   Neutro Abs 4.4 1.7 - 7.7 K/uL   Lymphocytes Relative 18 %   Lymphs Abs 1.0 0.7 - 4.0 K/uL   Monocytes Relative 6 %   Monocytes Absolute 0.4 0.1 - 1.0 K/uL   Eosinophils Relative 0 %   Eosinophils Absolute 0.0 0.0 - 0.7 K/uL   Basophils Relative 0 %   Basophils Absolute 0.0 0.0 - 0.1 K/uL   Ct Renal Stone Study 07/23/2015  CLINICAL DATA:  Left-sided flank pain with nausea and dizziness. History of kidney stones. Diabetes. EXAM: CT ABDOMEN AND PELVIS WITHOUT CONTRAST TECHNIQUE: Multidetector CT imaging of the abdomen and pelvis was performed following the standard protocol without IV contrast. COMPARISON:  06/01/2013 plain film.  Most recent CT of 12/13/2012. FINDINGS: Lower chest: Clear lung bases. Mild cardiomegaly. Multivessel coronary artery atherosclerosis. Small hiatal hernia. 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 No focal liver lesion. Gallstones without acute cholecystitis or biliary duct dilatation. Pancreas: Normal, without mass or ductal dilatation. Spleen: Normal in size, without focal abnormality. Adrenals/Urinary Tract: Normal adrenal glands. Upper pole right renal fluid density lesion is likely a cyst. Bilateral renal collecting system calculi. No hydronephrosis. No hydroureter or ureteric calculi. No bladder calculi. Stomach/Bowel: Normal remainder of the stomach. Scattered colonic diverticula. Normal terminal ileum and appendix. Normal small bowel. Vascular/Lymphatic: Aortic and branch vessel  atherosclerosis. No abdominopelvic adenopathy. Reproductive: Mild prostatomegaly. Other: No significant free fluid.  No free intraperitoneal air. Musculoskeletal: No acute osseous abnormality. IMPRESSION: 1. Bilateral nephrolithiasis. Interval passage of previously described ureteric stones. No obstructive uropathy or distal stone. 2. No other explanation for left-sided pain identified. 3.  Atherosclerosis, including within the coronary arteries. 4. Small  hiatal hernia. Electronically Signed   By: Abigail Miyamoto M.D.   On: 07/23/2015 16:32    1730:   Pt has been ambulatory while in the ED. States he "is fine now" and wants to go home. Workup is reassuring. VS remain stable. Dx and testing d/w pt.  Questions answered.  Verb understanding, agreeable to d/c home with outpt f/u.   Francine Graven, DO 07/25/15 2130

## 2015-07-23 NOTE — ED Notes (Signed)
Pt states he was working on the lawnmower today and began having left lower flank pain with nausea and dizziness.  States feels like previous kidney stones.

## 2015-09-15 ENCOUNTER — Encounter (HOSPITAL_COMMUNITY): Payer: Self-pay | Admitting: Emergency Medicine

## 2015-09-15 ENCOUNTER — Emergency Department (HOSPITAL_COMMUNITY)
Admission: EM | Admit: 2015-09-15 | Discharge: 2015-09-15 | Disposition: A | Discharge status: Home / Self Care | Payer: Medicare Other | Attending: Emergency Medicine | Admitting: Emergency Medicine

## 2015-09-15 ENCOUNTER — Emergency Department (HOSPITAL_COMMUNITY): Payer: Medicare Other

## 2015-09-15 DIAGNOSIS — Z7982 Long term (current) use of aspirin: Secondary | ICD-10-CM | POA: Diagnosis not present

## 2015-09-15 DIAGNOSIS — Z79899 Other long term (current) drug therapy: Secondary | ICD-10-CM | POA: Diagnosis not present

## 2015-09-15 DIAGNOSIS — E119 Type 2 diabetes mellitus without complications: Secondary | ICD-10-CM | POA: Insufficient documentation

## 2015-09-15 DIAGNOSIS — Z87891 Personal history of nicotine dependence: Secondary | ICD-10-CM | POA: Diagnosis not present

## 2015-09-15 DIAGNOSIS — R072 Precordial pain: Secondary | ICD-10-CM | POA: Diagnosis not present

## 2015-09-15 DIAGNOSIS — I10 Essential (primary) hypertension: Secondary | ICD-10-CM | POA: Insufficient documentation

## 2015-09-15 DIAGNOSIS — R079 Chest pain, unspecified: Secondary | ICD-10-CM | POA: Diagnosis not present

## 2015-09-15 LAB — COMPREHENSIVE METABOLIC PANEL
ALBUMIN: 4.2 g/dL (ref 3.5–5.0)
ALK PHOS: 51 U/L (ref 38–126)
ALT: 8 U/L — AB (ref 17–63)
AST: 14 U/L — AB (ref 15–41)
Anion gap: 4 — ABNORMAL LOW (ref 5–15)
BILIRUBIN TOTAL: 1.7 mg/dL — AB (ref 0.3–1.2)
BUN: 14 mg/dL (ref 6–20)
CALCIUM: 9.3 mg/dL (ref 8.9–10.3)
CO2: 26 mmol/L (ref 22–32)
CREATININE: 1.35 mg/dL — AB (ref 0.61–1.24)
Chloride: 108 mmol/L (ref 101–111)
GFR calc Af Amer: 59 mL/min — ABNORMAL LOW (ref >60)
GFR calc non Af Amer: 51 mL/min — ABNORMAL LOW (ref >60)
GLUCOSE: 104 mg/dL — AB (ref 65–99)
Potassium: 3.6 mmol/L (ref 3.5–5.1)
SODIUM: 138 mmol/L (ref 135–145)
TOTAL PROTEIN: 6.9 g/dL (ref 6.5–8.1)

## 2015-09-15 LAB — CBC WITH DIFFERENTIAL/PLATELET
BASOS ABS: 0 10*3/uL (ref 0.0–0.1)
BASOS PCT: 0 %
EOS ABS: 0 10*3/uL (ref 0.0–0.7)
Eosinophils Relative: 0 %
HEMATOCRIT: 44.8 % (ref 39.0–52.0)
HEMOGLOBIN: 15.3 g/dL (ref 13.0–17.0)
Lymphocytes Relative: 18 %
Lymphs Abs: 1.1 10*3/uL (ref 0.7–4.0)
MCH: 30.9 pg (ref 26.0–34.0)
MCHC: 34.2 g/dL (ref 30.0–36.0)
MCV: 90.5 fL (ref 78.0–100.0)
MONOS PCT: 6 %
Monocytes Absolute: 0.4 10*3/uL (ref 0.1–1.0)
NEUTROS ABS: 4.5 10*3/uL (ref 1.7–7.7)
NEUTROS PCT: 76 %
Platelets: 176 10*3/uL (ref 150–400)
RBC: 4.95 MIL/uL (ref 4.22–5.81)
RDW: 13.5 % (ref 11.5–15.5)
WBC: 5.9 10*3/uL (ref 4.0–10.5)

## 2015-09-15 LAB — I-STAT TROPONIN, ED
TROPONIN I, POC: 0 ng/mL (ref 0.00–0.08)
Troponin i, poc: 0 ng/mL (ref 0.00–0.08)

## 2015-09-15 NOTE — Discharge Instructions (Signed)
Follow-up with the cardiologist here in [place] next week. If she can't get an appointment and follow-up with your family doctor next week return if any problems

## 2015-09-15 NOTE — ED Provider Notes (Signed)
CSN: [id number]     Arrival date & time 09/15/15  1203 History  By signing my name below, I, Higinio Plan, attest that this documentation has been prepared under the direction and in the presence of Milton Ferguson, MD . Electronically Signed: Higinio Plan, Scribe. 09/15/2015. 12:25 PM.   Chief Complaint  Patient presents with  . Chest Pain   Patient is a 70 y.o. male presenting with chest pain. The history is provided by the patient. No language interpreter was used.  Chest Pain Pain location:  Substernal area Pain radiates to:  Does not radiate Pain severity:  Mild Onset quality:  Sudden Timing:  Intermittent Progression:  Worsening Chronicity:  New Relieved by:  Nothing Worsened by:  Nothing tried Ineffective treatments:  Aspirin Associated symptoms: no abdominal pain, no back pain, no cough, no diaphoresis, no fatigue, no headache and no shortness of breath    HPI Comments: [name] is a 70 y.o. male with PMHx of HTN and DM, who presents to the Emergency Department complaining of sudden onset, intermittent, mid-sternal chest pain that began 3 days ago and worsened today. Pt reports his pain is improved upon lying down. He denies shortness of breath or diaphoresis. He also denies cardiac hx and states he has not had a stress test or seen a cardiologist to examine his heart. Pt states he took 1 baby aspirin before arriving in the ED. Pt reports he was taken off of his DM medication after he lost over 60 lbs from walking.    Past Medical History  Diagnosis Date  . Hypertension   . Diabetes mellitus   . HOH (hard of hearing)   . Recurrent kidney stones   . Kidney stone    Past Surgical History  Procedure Laterality Date  . Lithotripsy      X 2  . Colonoscopy  07/15/2011    Procedure: COLONOSCOPY;  Surgeon: Jamesetta So, MD;  [facility];  Service: Gastroenterology;  Laterality: N/A;  . Cystoscopy w/ ureteral stent placement Left 12/28/2012    Procedure: CYSTOSCOPY  WITH RETROGRADE PYELOGRAM/LEFT URETERAL STENT PLACEMENT;  Surgeon: Marissa Nestle, MD;  Location: [facility];  Service: Urology;  Laterality: Left;  . Stone extraction with basket Left 12/28/2012    Procedure: STONE EXTRACTION WITH BASKET;  Surgeon: Marissa Nestle, MD;  Location: [facility];  Service: Urology;  Laterality: Left;  . Holmium laser application Left [id number]    Procedure: HOLMIUM LASER APPLICATION;  Surgeon: Marissa Nestle, MD;  Location: [facility];  Service: Urology;  Laterality: Left;  . Extracorporeal shock wave lithotripsy Right 01/05/2013    Procedure: EXTRACORPOREAL SHOCK WAVE LITHOTRIPSY (ESWL) RIGHT URETERAL CALCULUS;  Surgeon: Marissa Nestle, MD;  Location: [facility];  Service: Urology;  Laterality: Right;  . Cystoscopy w/ ureteral stent removal Left 01/25/2013    Procedure: CYSTOSCOPY WITH LEFT URETERALSTENT REMOVAL;  Surgeon: Marissa Nestle, MD;  Location: [facility];  Service: Urology;  Laterality: Left;  . Cystoscopy w/ retrogrades Right 01/25/2013    Procedure: CYSTOSCOPY WITH RIGHT RETROGRADE PYELOGRAM, BALLOON DILATION RIGHT URETER AND RIGHT URETERAL STENT PLACEMENT;  Surgeon: Marissa Nestle, MD;  Location: [facility];  Service: Urology;  Laterality: Right;  . Extracorporeal shock wave lithotripsy Right 02/02/2013    Procedure: EXTRACORPOREAL SHOCK WAVE LITHOTRIPSY (ESWL) RIGHT URETERAL CALCULUS;  Surgeon: Marissa Nestle, MD;  Location: [facility];  Service: Urology;  Laterality: Right;  . Extracorporeal shock wave lithotripsy Right 05/25/2013  Procedure: EXTRACORPOREAL SHOCK WAVE LITHOTRIPSY (ESWL) RIGHT URETERAL CALCULUS;  Surgeon: Marissa Nestle, MD;  Location: [facility];  Service: Urology;  Laterality: Right;  . Lithotripsy     Family History  Problem Relation Age of Onset  . Colon cancer Paternal Uncle   . Stroke Mother   . Heart attack Father    Social History  Substance Use Topics  . Smoking status: Former Smoker    Types: Cigarettes    Quit date: 02/25/1980   . Smokeless tobacco: Former Systems developer    Types: Snuff, Chew  . Alcohol Use: No    Review of Systems  Constitutional: Negative for diaphoresis, appetite change and fatigue.  HENT: Negative for congestion, ear discharge and sinus pressure.   Eyes: Negative for discharge.  Respiratory: Negative for cough and shortness of breath.   Cardiovascular: Positive for chest pain.  Gastrointestinal: Negative for abdominal pain and diarrhea.  Genitourinary: Negative for frequency and hematuria.  Musculoskeletal: Negative for back pain.  Skin: Negative for rash.  Neurological: Negative for seizures and headaches.  Psychiatric/Behavioral: Negative for hallucinations.    Allergies  Review of patient's allergies indicates no known allergies.  Home Medications   Prior to Admission medications   Medication Sig Start Date End Date Taking? Authorizing Provider  amLODipine-valsartan (EXFORGE) 5-160 MG per tablet Take 1 tablet by mouth daily.    Yes Historical Provider, MD  aspirin EC 81 MG tablet Take 81 mg by mouth daily.   Yes Historical Provider, MD  diazepam (VALIUM) 10 MG tablet Take 10 mg by mouth 3 (three) times daily as needed for anxiety.   Yes Historical Provider, MD  potassium chloride SA (K-DUR,KLOR-CON) 20 MEQ tablet Take 20 mEq by mouth daily. 01/24/14  Yes Historical Provider, MD  PROAIR HFA 108 (90 Base) MCG/ACT inhaler INHALE TWO PUFFS BY MOUTH EVERY 4 TO 6 HOURS AS NEEDED FOR SHORTNESS OF BREATH 04/27/15  Yes Historical Provider, MD  traMADol (ULTRAM) 50 MG tablet Take by mouth every 6 (six) hours as needed for moderate pain.   Yes Historical Provider, MD   BP 136/83 mmHg  Pulse 78  Temp(Src) 97.6 F (36.4 C) (Oral)  Resp 18  Ht 5\' 8"  (1.727 m)  Wt 165 lb (74.844 kg)  BMI 25.09 kg/m2  SpO2 98% Physical Exam  Constitutional: He is oriented to person, place, and time. He appears well-developed.  HENT:  Head: Normocephalic.  Eyes: Conjunctivae and EOM are normal. No scleral icterus.   Neck: Neck supple. No thyromegaly present.  Cardiovascular: Normal rate and regular rhythm.  Exam reveals no gallop and no friction rub.   No murmur heard. Pulmonary/Chest: No stridor. He has no wheezes. He has no rales. He exhibits no tenderness.  Abdominal: He exhibits no distension. There is no tenderness. There is no rebound.  Musculoskeletal: Normal range of motion. He exhibits no edema.  Lymphadenopathy:    He has no cervical adenopathy.  Neurological: He is oriented to person, place, and time. He exhibits normal muscle tone. Coordination normal.  Skin: No rash noted. No erythema.  Psychiatric: He has a normal mood and affect. His behavior is normal.    ED Course  Procedures  DIAGNOSTIC STUDIES:  Oxygen Saturation is 98% on RA, normal by my interpretation.    COORDINATION OF CARE:  12:21 PM Discussed treatment plan with pt at bedside and pt agreed to plan.  Labs Review Labs Reviewed  BASIC METABOLIC PANEL  CBC  I-STAT [place], ED    Imaging Review  Dg Chest 2 View  09/15/2015  CLINICAL DATA:  Chest pain. EXAM: CHEST  2 VIEW COMPARISON:  Abdominal series on 03/18/2012 FINDINGS: The heart size and mediastinal contours are within normal limits. There is no evidence of pulmonary edema, consolidation, pneumothorax, nodule or pleural fluid. The visualized skeletal structures are unremarkable. IMPRESSION: No active cardiopulmonary disease. Electronically Signed   By: Aletta Edouard M.D.   On: 09/15/2015 12:59   I have personally reviewed and evaluated these images and lab results as part of my medical decision-making.   EKG Interpretation None      MDM   Final diagnoses:  None      I personally performed the services described in this documentation, which was scribed in my presence. The recorded information has been reviewed and is accurate. \ Patient presented with chest pain. Not related to exertion. Patient had 2 troponins that were negative and EKG is  unremarkable. An chest x-ray negative patient had no chest pain and discharge. He has been referred to cardiology and is to follow-up with family doctor next week if he can't get in the cardiology. Patient is continuing his aspirin  The chart was scribed for me under my direct supervision.  I personally performed the history, physical, and medical decision making and all procedures in the evaluation of this patient.Milton Ferguson, MD 09/15/15 1600

## 2015-09-15 NOTE — ED Notes (Signed)
Patient c/o mid-sternal chest pain that started 3 days but is progressively getting worse. Patient states pain is intermittent and improves upon laying. Denies any other symptoms. Denies cardiac hx. Patient does report taking 1 baby aspirin.

## 2015-09-25 DIAGNOSIS — Z6824 Body mass index (BMI) 24.0-24.9, adult: Secondary | ICD-10-CM | POA: Diagnosis not present

## 2015-09-25 DIAGNOSIS — Z1389 Encounter for screening for other disorder: Secondary | ICD-10-CM | POA: Diagnosis not present

## 2015-09-25 DIAGNOSIS — F419 Anxiety disorder, unspecified: Secondary | ICD-10-CM | POA: Diagnosis not present

## 2015-09-25 DIAGNOSIS — R0789 Other chest pain: Secondary | ICD-10-CM | POA: Diagnosis not present

## 2015-10-05 ENCOUNTER — Encounter: Payer: Self-pay | Admitting: Cardiovascular Disease

## 2015-10-05 ENCOUNTER — Ambulatory Visit (INDEPENDENT_AMBULATORY_CARE_PROVIDER_SITE_OTHER): Payer: Medicare Other | Admitting: Cardiovascular Disease

## 2015-10-05 VITALS — BP 120/76 | HR 72 | Ht 68.0 in | Wt 159.0 lb

## 2015-10-05 DIAGNOSIS — R072 Precordial pain: Secondary | ICD-10-CM | POA: Diagnosis not present

## 2015-10-05 DIAGNOSIS — Z9289 Personal history of other medical treatment: Secondary | ICD-10-CM

## 2015-10-05 DIAGNOSIS — Z87898 Personal history of other specified conditions: Secondary | ICD-10-CM | POA: Diagnosis not present

## 2015-10-05 DIAGNOSIS — I10 Essential (primary) hypertension: Secondary | ICD-10-CM

## 2015-10-05 NOTE — Progress Notes (Signed)
CARDIOLOGY CONSULT NOTE  Patient ID: [name] MRN: [id number] DOB/AGE: [birth year] 70 y.o.  Admit date: (Not on file) Primary Physician: Purvis Kilts, MD Referring Physician:   Reason for Consultation: chest pain  HPI: 70 year old male with hypertension referred for the evaluation of chest pain. He was recently evaluated in the ED in late July. Troponins and chest x-ray were normal. 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  2 days prior to his ED presentation, he had lifted a lawnmower to put on a truck and then developed chest discomfort. He said "it came and went ". Describes it as a dull ache. Presented to the ED 2 days later.  He likes to walk a lot and walks in the woods frequently. He has had some exertional chest tightness, once at [facility] and once on another occasion. He denies shortness of breath, palpitations, lightheadedness, dizziness, leg swelling, orthopnea, and syncope.  No Known Allergies  Current Outpatient Prescriptions  Medication Sig Dispense Refill  . amLODipine-valsartan (EXFORGE) 5-160 MG per tablet Take 1 tablet by mouth daily.     Marland Kitchen aspirin EC 81 MG tablet Take 81 mg by mouth daily.    . diazepam (VALIUM) 10 MG tablet Take 10 mg by mouth 3 (three) times daily as needed for anxiety.    Marland Kitchen escitalopram (LEXAPRO) 5 MG tablet Take 5 mg by mouth daily.    . potassium chloride SA (K-DUR,KLOR-CON) 20 MEQ tablet Take 20 mEq by mouth daily.  2  . traMADol (ULTRAM) 50 MG tablet Take by mouth every 6 (six) hours as needed for moderate pain.     No current facility-administered medications for this visit.     Past Medical History:  Diagnosis Date  . Diabetes mellitus   . HOH (hard of hearing)   . Hypertension   . Kidney stone   . Recurrent kidney stones     Past Surgical History:  Procedure Laterality Date  . COLONOSCOPY  07/15/2011   Procedure: COLONOSCOPY;  Surgeon: Jamesetta So, MD;  [facility];  Service: Gastroenterology;  Laterality: N/A;  . CYSTOSCOPY W/ RETROGRADES Right 01/25/2013   Procedure: CYSTOSCOPY WITH RIGHT RETROGRADE PYELOGRAM, BALLOON DILATION RIGHT URETER AND RIGHT URETERAL STENT PLACEMENT;  Surgeon: Marissa Nestle, MD;  Location: [facility];  Service: Urology;  Laterality: Right;  . CYSTOSCOPY W/ URETERAL STENT PLACEMENT Left 12/28/2012   Procedure: CYSTOSCOPY WITH RETROGRADE PYELOGRAM/LEFT URETERAL STENT PLACEMENT;  Surgeon: Marissa Nestle, MD;  Location: [facility];  Service: Urology;  Laterality: Left;  . CYSTOSCOPY W/ URETERAL STENT REMOVAL Left 01/25/2013   Procedure: CYSTOSCOPY WITH LEFT URETERALSTENT REMOVAL;  Surgeon: Marissa Nestle, MD;  Location: [facility];  Service: Urology;  Laterality: Left;  . EXTRACORPOREAL SHOCK WAVE LITHOTRIPSY Right 01/05/2013   Procedure: EXTRACORPOREAL SHOCK WAVE LITHOTRIPSY (ESWL) RIGHT URETERAL CALCULUS;  Surgeon: Marissa Nestle, MD;  Location: [facility];  Service: Urology;  Laterality: Right;  . EXTRACORPOREAL SHOCK WAVE LITHOTRIPSY Right 02/02/2013   Procedure: EXTRACORPOREAL SHOCK WAVE LITHOTRIPSY (ESWL) RIGHT URETERAL CALCULUS;  Surgeon: Marissa Nestle, MD;  [facility];  Service: Urology;  Laterality: Right;  . EXTRACORPOREAL SHOCK WAVE LITHOTRIPSY Right 05/25/2013   Procedure: EXTRACORPOREAL SHOCK WAVE LITHOTRIPSY (ESWL) RIGHT URETERAL CALCULUS;  Surgeon: Marissa Nestle, MD;  [facility];  Service: Urology;  Laterality: Right;  . HOLMIUM LASER APPLICATION Left [id number]   Procedure: HOLMIUM LASER APPLICATION;  Surgeon: Inda Merlin  Rubin Payor, MD;  Location: [facility];  Service: Urology;  Laterality: Left;  . LITHOTRIPSY     X 2  . LITHOTRIPSY    . STONE EXTRACTION WITH BASKET Left 12/28/2012   Procedure: STONE EXTRACTION WITH BASKET;  Surgeon: Marissa Nestle, MD;  [facility];  Service: Urology;  Laterality: Left;    Social History   Social History  . Marital status: Single      Spouse name: N/A  . Number of children: N/A  . Years of education: N/A   Occupational History  . Not on file.   Social History Main Topics  . Smoking status: Former Smoker    Types: Cigarettes    Quit date: 02/25/1980  . Smokeless tobacco: Former Systems developer    Types: Snuff, Chew  . Alcohol use No  . Drug use: No  . Sexual activity: Not Currently   Other Topics Concern  . Not on file   Social History Narrative   ** Merged History Encounter **         No family history of premature CAD in 1st degree relatives.  Prior to Admission medications   Medication Sig Start Date End Date Taking? Authorizing Provider  amLODipine-valsartan (EXFORGE) 5-160 MG per tablet Take 1 tablet by mouth daily.    Yes Historical Provider, MD  aspirin EC 81 MG tablet Take 81 mg by mouth daily.   Yes Historical Provider, MD  diazepam (VALIUM) 10 MG tablet Take 10 mg by mouth 3 (three) times daily as needed for anxiety.   Yes Historical Provider, MD  escitalopram (LEXAPRO) 5 MG tablet Take 5 mg by mouth daily.   Yes Historical Provider, MD  potassium chloride SA (K-DUR,KLOR-CON) 20 MEQ tablet Take 20 mEq by mouth daily. 01/24/14  Yes Historical Provider, MD  traMADol (ULTRAM) 50 MG tablet Take by mouth every 6 (six) hours as needed for moderate pain.   Yes Historical Provider, MD     Review of systems complete and found to be negative unless listed above in HPI     Physical exam Blood pressure 120/76, pulse 72, height 5\' 8"  (1.727 m), weight 159 lb (72.1 kg), SpO2 95 %. General: NAD Neck: No JVD, no thyromegaly or thyroid nodule.  Lungs: Clear to auscultation bilaterally with normal respiratory effort. CV: Nondisplaced PMI. Regular rate and rhythm, normal S1/S2, no S3/S4, no murmur.  No peripheral edema.  No carotid bruit.   Abdomen: Soft, nontender, no distention.  Skin: Intact without lesions or rashes.  Neurologic: Alert and oriented x 3.  Psych: Normal affect. Extremities: No clubbing or  cyanosis.  HEENT: Normal.   ECG: Most recent ECG reviewed.  Labs:   Lab Results  Component Value Date   WBC 5.9 09/15/2015   HGB 15.3 09/15/2015   HCT 44.8 09/15/2015   MCV 90.5 09/15/2015   PLT 176 09/15/2015   No results for input(s): NA, K, CL, CO2, BUN, CREATININE, CALCIUM, PROT, BILITOT, ALKPHOS, ALT, AST, GLUCOSE in the last 168 hours.  Invalid input(s): LABALBU No results found for: CKTOTAL, CKMB, CKMBINDEX, TROPONINI No results found for: CHOL No results found for: HDL No results found for: LDLCALC No results found for: TRIG No results found for: CHOLHDL No results found for: LDLDIRECT       Studies: No results found.  ASSESSMENT AND PLAN:  1. Chest pain: I will proceed with a GXT to evaluate for ischemic heart disease. Continue ASA 81 mg.  2. HTN: Controlled. No changes.  Dispo:  fu 2 months.    Signed: Kate Sable, M.D., F.A.C.C.  10/05/2015, 9:11 AM

## 2015-10-05 NOTE — Patient Instructions (Signed)
Your physician recommends that you schedule a follow-up appointment in: 2 months   Your physician has requested that you have an exercise tolerance test. For further information please visit [email or web address]. Please also follow instruction sheet, as given.     Your physician recommends that you continue on your current medications as directed. Please refer to the Current Medication list given to you today.     Thank you for choosing [facility] !

## 2015-10-12 ENCOUNTER — Inpatient Hospital Stay (HOSPITAL_COMMUNITY): Admission: RE | Admit: 2015-10-12 | Payer: Self-pay | Source: Ambulatory Visit

## 2015-10-16 ENCOUNTER — Ambulatory Visit (HOSPITAL_COMMUNITY)
Admission: RE | Admit: 2015-10-16 | Discharge: 2015-10-16 | Disposition: A | Discharge status: Home / Self Care | Payer: Medicare Other | Source: Ambulatory Visit | Attending: Cardiovascular Disease | Admitting: Cardiovascular Disease

## 2015-10-16 DIAGNOSIS — Z1389 Encounter for screening for other disorder: Secondary | ICD-10-CM | POA: Diagnosis not present

## 2015-10-16 DIAGNOSIS — R072 Precordial pain: Secondary | ICD-10-CM | POA: Diagnosis not present

## 2015-10-16 DIAGNOSIS — I10 Essential (primary) hypertension: Secondary | ICD-10-CM | POA: Diagnosis not present

## 2015-10-16 DIAGNOSIS — Z6824 Body mass index (BMI) 24.0-24.9, adult: Secondary | ICD-10-CM | POA: Diagnosis not present

## 2015-10-16 LAB — EXERCISE TOLERANCE TEST
CHL CUP RESTING HR STRESS: 63 {beats}/min
CHL RATE OF PERCEIVED EXERTION: 14
CSEPEDS: 6 s
Estimated workload: 7 METS
Exercise duration (min): 4 min
MPHR: 148 {beats}/min
Peak HR: 137 {beats}/min
Percent HR: 92 %

## 2015-12-04 ENCOUNTER — Other Ambulatory Visit: Payer: Self-pay | Admitting: Cardiovascular Disease

## 2015-12-04 ENCOUNTER — Encounter: Payer: Self-pay | Admitting: Cardiovascular Disease

## 2015-12-04 ENCOUNTER — Ambulatory Visit (INDEPENDENT_AMBULATORY_CARE_PROVIDER_SITE_OTHER): Payer: Medicare Other | Admitting: Cardiovascular Disease

## 2015-12-04 VITALS — BP 118/72 | HR 78 | Ht 68.0 in | Wt 157.0 lb

## 2015-12-04 DIAGNOSIS — R072 Precordial pain: Secondary | ICD-10-CM

## 2015-12-04 DIAGNOSIS — I10 Essential (primary) hypertension: Secondary | ICD-10-CM

## 2015-12-04 MED ORDER — NITROGLYCERIN 0.4 MG SL SUBL: 0.4000 mg | SUBLINGUAL_TABLET | SUBLINGUAL | 3 refills | Status: AC | PRN

## 2015-12-04 NOTE — Patient Instructions (Signed)
Your physician wants you to follow-up in: 6 months Dr Koneswaran You will receive a reminder letter in the mail two months in advance. If you don't receive a letter, please call our office to schedule the follow-up appointment.    Your physician recommends that you continue on your current medications as directed. Please refer to the Current Medication list given to you today.    Thank you for choosing [facility] !        

## 2015-12-04 NOTE — Progress Notes (Signed)
SUBJECTIVE: The patient returns for follow-up after undergoing cardiovascular testing performed for the evaluation of chest pain. Exercise treadmill stress test 10/16/15 did not show ischemic changes but was deemed intermediate risk due to limited exercise duration.  Has had one more episode of chest pain which occurred while walking but resolved within a few minutes of rest. Not as intense as pain prior to ED visit.   Review of Systems: As per "subjective", otherwise negative.  No Known Allergies  Current Outpatient Prescriptions  Medication Sig Dispense Refill  . amLODipine-valsartan (EXFORGE) 5-160 MG per tablet Take 1 tablet by mouth daily. Pt taking 1/2 tablet    . aspirin EC 81 MG tablet Take 81 mg by mouth daily.    . diazepam (VALIUM) 10 MG tablet Take 10 mg by mouth 3 (three) times daily as needed for anxiety.    Marland Kitchen escitalopram (LEXAPRO) 5 MG tablet Take 5 mg by mouth daily.    . potassium chloride SA (K-DUR,KLOR-CON) 20 MEQ tablet Take 20 mEq by mouth daily.  2  . traMADol (ULTRAM) 50 MG tablet Take by mouth every 6 (six) hours as needed for moderate pain.     No current facility-administered medications for this visit.     Past Medical History:  Diagnosis Date  . Diabetes mellitus   . HOH (hard of hearing)   . Hypertension   . Kidney stone   . Recurrent kidney stones     Past Surgical History:  Procedure Laterality Date  . COLONOSCOPY  07/15/2011   Procedure: COLONOSCOPY;  Surgeon: Jamesetta So, MD;  [facility];  Service: Gastroenterology;  Laterality: N/A;  . CYSTOSCOPY W/ RETROGRADES Right 01/25/2013   Procedure: CYSTOSCOPY WITH RIGHT RETROGRADE PYELOGRAM, BALLOON DILATION RIGHT URETER AND RIGHT URETERAL STENT PLACEMENT;  Surgeon: Marissa Nestle, MD;  [facility];  Service: Urology;  Laterality: Right;  . CYSTOSCOPY W/ URETERAL STENT PLACEMENT Left 12/28/2012   Procedure: CYSTOSCOPY WITH RETROGRADE PYELOGRAM/LEFT URETERAL STENT  PLACEMENT;  Surgeon: Marissa Nestle, MD;  [facility];  Service: Urology;  Laterality: Left;  . CYSTOSCOPY W/ URETERAL STENT REMOVAL Left 01/25/2013   Procedure: CYSTOSCOPY WITH LEFT URETERALSTENT REMOVAL;  Surgeon: Marissa Nestle, MD;  [facility];  Service: Urology;  Laterality: Left;  . EXTRACORPOREAL SHOCK WAVE LITHOTRIPSY Right 01/05/2013   Procedure: EXTRACORPOREAL SHOCK WAVE LITHOTRIPSY (ESWL) RIGHT URETERAL CALCULUS;  Surgeon: Marissa Nestle, MD;  [facility];  Service: Urology;  Laterality: Right;  . EXTRACORPOREAL SHOCK WAVE LITHOTRIPSY Right 02/02/2013   Procedure: EXTRACORPOREAL SHOCK WAVE LITHOTRIPSY (ESWL) RIGHT URETERAL CALCULUS;  Surgeon: Marissa Nestle, MD;  [facility];  Service: Urology;  Laterality: Right;  . EXTRACORPOREAL SHOCK WAVE LITHOTRIPSY Right 05/25/2013   Procedure: EXTRACORPOREAL SHOCK WAVE LITHOTRIPSY (ESWL) RIGHT URETERAL CALCULUS;  Surgeon: Marissa Nestle, MD;  [facility];  Service: Urology;  Laterality: Right;  . HOLMIUM LASER APPLICATION Left [id number]   Procedure: HOLMIUM LASER APPLICATION;  Surgeon: Marissa Nestle, MD;  Location[facility];  Service: Urology;  Laterality: Left;  . LITHOTRIPSY     X 2  . LITHOTRIPSY    . STONE EXTRACTION WITH BASKET Left 12/28/2012   Procedure: STONE EXTRACTION WITH BASKET;  Surgeon: Marissa Nestle, MD;  [facility];  Service: Urology;  Laterality: Left;    Social History   Social History  . Marital status: Single    Spouse name: N/A  . Number of children: N/A  .  Years of education: N/A   Occupational History  . Not on file.   Social History Main Topics  . Smoking status: Former Smoker    Types: Cigarettes    Quit date: 02/25/1980  . Smokeless tobacco: Former Systems developer    Types: Snuff, Chew  . Alcohol use No  . Drug use: No  . Sexual activity: Not Currently   Other Topics Concern  . Not on file   Social History Narrative   ** Merged History Encounter **          Vitals:   12/04/15 0827  BP: 118/72  Pulse: 78  SpO2: 97%  Weight: 157 lb (71.2 kg)  Height: 5\' 8"  (1.727 m)    PHYSICAL EXAM General: NAD HEENT: Normal. Neck: No JVD, no thyromegaly. Lungs: Clear to auscultation bilaterally with normal respiratory effort. CV: Nondisplaced PMI.  Regular rate and rhythm, normal S1/S2, no S3/S4, no murmur. No pretibial or periankle edema.    Abdomen: Soft, nontender, no distention.  Neurologic: Alert and oriented.  Psych: Normal affect. Skin: Normal. Musculoskeletal: No gross deformities.    ECG: Most recent ECG reviewed.      ASSESSMENT AND PLAN: 1. Chest pain: GXT reviewed above. Symptoms are stable. Continue ASA 81 mg. Will prescribe SL nitroglycerin. If symptoms recur and/or become more frequent, would consider coronary angiography vs nuclear imaging.  2. HTN: Controlled. No changes.  Dispo: fu 6 months.    Kate Sable, M.D., F.A.C.C.

## 2015-12-07 DIAGNOSIS — J302 Other seasonal allergic rhinitis: Secondary | ICD-10-CM | POA: Diagnosis not present

## 2015-12-07 DIAGNOSIS — Z6824 Body mass index (BMI) 24.0-24.9, adult: Secondary | ICD-10-CM | POA: Diagnosis not present

## 2015-12-07 DIAGNOSIS — Z1389 Encounter for screening for other disorder: Secondary | ICD-10-CM | POA: Diagnosis not present

## 2015-12-13 DIAGNOSIS — Z23 Encounter for immunization: Secondary | ICD-10-CM | POA: Diagnosis not present

## 2016-03-10 DIAGNOSIS — Z1389 Encounter for screening for other disorder: Secondary | ICD-10-CM | POA: Diagnosis not present

## 2016-03-10 DIAGNOSIS — I10 Essential (primary) hypertension: Secondary | ICD-10-CM | POA: Diagnosis not present

## 2016-03-10 DIAGNOSIS — E782 Mixed hyperlipidemia: Secondary | ICD-10-CM | POA: Diagnosis not present

## 2016-05-19 DIAGNOSIS — E039 Hypothyroidism, unspecified: Secondary | ICD-10-CM | POA: Diagnosis not present

## 2016-06-09 ENCOUNTER — Ambulatory Visit: Payer: Self-pay | Admitting: Cardiovascular Disease

## 2016-06-24 ENCOUNTER — Encounter: Payer: Self-pay | Admitting: Cardiovascular Disease

## 2016-06-24 ENCOUNTER — Ambulatory Visit (INDEPENDENT_AMBULATORY_CARE_PROVIDER_SITE_OTHER): Payer: Medicare Other | Admitting: Cardiovascular Disease

## 2016-06-24 VITALS — BP 142/89 | HR 70 | Ht 68.0 in | Wt 176.0 lb

## 2016-06-24 DIAGNOSIS — R072 Precordial pain: Secondary | ICD-10-CM | POA: Diagnosis not present

## 2016-06-24 DIAGNOSIS — I10 Essential (primary) hypertension: Secondary | ICD-10-CM | POA: Diagnosis not present

## 2016-06-24 NOTE — Progress Notes (Signed)
SUBJECTIVE: The patient presents for follow-up of chest pain and hypertension. Exercise treadmill stress test 10/16/15 did not show ischemic changes but was deemed intermediate risk due to limited exercise duration.  In the past 6 months, he has had one episode of chest pain for which he took 2 nitroglycerin tablets. This occurred while he was mowing uphill and vigorously exerting himself. The pain resolved within 5-6 minutes.  He denies shortness of breath, leg swelling, palpitations, and dizziness.   Review of Systems: As per "subjective", otherwise negative.  No Known Allergies  Current Outpatient Prescriptions  Medication Sig Dispense Refill  . amLODipine-valsartan (EXFORGE) 5-160 MG per tablet Take 1 tablet by mouth daily. Pt taking 1/2 tablet    . aspirin EC 81 MG tablet Take 81 mg by mouth daily.    . diazepam (VALIUM) 10 MG tablet Take 10 mg by mouth 3 (three) times daily as needed for anxiety.    Marland Kitchen escitalopram (LEXAPRO) 5 MG tablet Take 5 mg by mouth daily.    . nitroGLYCERIN (NITROSTAT) 0.4 MG SL tablet Place 1 tablet (0.4 mg total) under the tongue every 5 (five) minutes as needed. 25 tablet 3  . potassium chloride SA (K-DUR,KLOR-CON) 20 MEQ tablet Take 20 mEq by mouth daily.  2  . traMADol (ULTRAM) 50 MG tablet Take by mouth every 6 (six) hours as needed for moderate pain.     No current facility-administered medications for this visit.     Past Medical History:  Diagnosis Date  . Diabetes mellitus   . HOH (hard of hearing)   . Hypertension   . Kidney stone   . Recurrent kidney stones     Past Surgical History:  Procedure Laterality Date  . COLONOSCOPY  07/15/2011   Procedure: COLONOSCOPY;  Surgeon: Jamesetta So, MD;  [facility];  Service: Gastroenterology;  Laterality: N/A;  . CYSTOSCOPY W/ RETROGRADES Right 01/25/2013   Procedure: CYSTOSCOPY WITH RIGHT RETROGRADE PYELOGRAM, BALLOON DILATION RIGHT URETER AND RIGHT URETERAL STENT PLACEMENT;   Surgeon: Marissa Nestle, MD;  [facility];  Service: Urology;  Laterality: Right;  . CYSTOSCOPY W/ URETERAL STENT PLACEMENT Left 12/28/2012   Procedure: CYSTOSCOPY WITH RETROGRADE PYELOGRAM/LEFT URETERAL STENT PLACEMENT;  Surgeon: Marissa Nestle, MD;  [facility];  Service: Urology;  Laterality: Left;  . CYSTOSCOPY W/ URETERAL STENT REMOVAL Left 01/25/2013   Procedure: CYSTOSCOPY WITH LEFT URETERALSTENT REMOVAL;  Surgeon: Marissa Nestle, MD;  [facility];  Service: Urology;  Laterality: Left;  . EXTRACORPOREAL SHOCK WAVE LITHOTRIPSY Right 01/05/2013   Procedure: EXTRACORPOREAL SHOCK WAVE LITHOTRIPSY (ESWL) RIGHT URETERAL CALCULUS;  Surgeon: Marissa Nestle, MD;  [facility];  Service: Urology;  Laterality: Right;  . EXTRACORPOREAL SHOCK WAVE LITHOTRIPSY Right 02/02/2013   Procedure: EXTRACORPOREAL SHOCK WAVE LITHOTRIPSY (ESWL) RIGHT URETERAL CALCULUS;  Surgeon: Marissa Nestle, MD;  [facility];  Service: Urology;  Laterality: Right;  . EXTRACORPOREAL SHOCK WAVE LITHOTRIPSY Right 05/25/2013   Procedure: EXTRACORPOREAL SHOCK WAVE LITHOTRIPSY (ESWL) RIGHT URETERAL CALCULUS;  Surgeon: Marissa Nestle, MD;  [facility];  Service: Urology;  Laterality: Right;  . HOLMIUM LASER APPLICATION Left 31/06/4006   Procedure: HOLMIUM LASER APPLICATION;  Surgeon: Marissa Nestle, MD;  [facility];  Service: Urology;  Laterality: Left;  . LITHOTRIPSY     X 2  . LITHOTRIPSY    . STONE EXTRACTION WITH BASKET Left 12/28/2012   Procedure: STONE EXTRACTION WITH BASKET;  Surgeon: Inda Merlin  Rubin Payor, MD;  Location[facility];  Service: Urology;  Laterality: Left;    Social History   Social History  . Marital status: Single    Spouse name: N/A  . Number of children: N/A  . Years of education: N/A   Occupational History  . Not on file.   Social History Main Topics  . Smoking status: Former Smoker    Types: Cigarettes    Quit date: 02/25/1980  . Smokeless tobacco:  Former Systems developer    Types: Snuff, Chew  . Alcohol use No  . Drug use: No  . Sexual activity: Not Currently   Other Topics Concern  . Not on file   Social History Narrative   ** Merged History Encounter **         Vitals:   06/24/16 1334  BP: (!) 142/89  Pulse: 70  SpO2: 95%  Weight: 176 lb (79.8 kg)  Height: 5\' 8"  (1.727 m)    Wt Readings from Last 3 Encounters:  06/24/16 176 lb (79.8 kg)  12/04/15 157 lb (71.2 kg)  10/05/15 159 lb (72.1 kg)     PHYSICAL EXAM General: NAD HEENT: Normal. Neck: No JVD, no thyromegaly. Lungs: Clear to auscultation bilaterally with normal respiratory effort. CV: Nondisplaced PMI.  Regular rate and rhythm, normal S1/S2, no W1/X9, soft 1/6 systolic murmur over RUSB. No pretibial or periankle edema.  No carotid bruit.   Abdomen: Soft, nontender, no distention.  Neurologic: Alert and oriented.  Psych: Normal affect. Skin: Normal. Musculoskeletal: No gross deformities.    ECG: Most recent ECG reviewed.   Labs: Lab Results  Component Value Date/Time   K 3.6 09/15/2015 12:18 PM   K 3.7 11/17/2013 05:52 PM   BUN 14 09/15/2015 12:18 PM   BUN 11 11/17/2013 05:52 PM   CREATININE 1.35 (H) 09/15/2015 12:18 PM   CREATININE 1.37 (H) 11/17/2013 05:52 PM   ALT 8 (L) 09/15/2015 12:18 PM   ALT 13 (L) 11/17/2013 05:52 PM   TSH 3.86 11/17/2013 05:52 PM   HGB 15.3 09/15/2015 12:18 PM   HGB 13.1 11/17/2013 05:52 PM     Lipids: No results found for: LDLCALC, LDLDIRECT, CHOL, TRIG, HDL     ASSESSMENT AND PLAN: 1. Chest pain: GXT reviewed above. Symptomatically stable. Continue aspirin. If symptoms recur and/or become more frequent, I would consider coronary angiography versus nuclear imaging.  2. Hypertension: Mildly elevated. Had apparently been taking a higher dose of antihypertensive therapy but blood pressure became too low and PCP reduced the dose. Will monitor.    Disposition: Follow up 6 months  Kate Sable, M.D., F.A.C.C.

## 2016-06-24 NOTE — Patient Instructions (Signed)
Your physician wants you to follow-up in: 6 months Dr Koneswaran You will receive a reminder letter in the mail two months in advance. If you don't receive a letter, please call our office to schedule the follow-up appointment.    Your physician recommends that you continue on your current medications as directed. Please refer to the Current Medication list given to you today.     If you need a refill on your cardiac medications before your next appointment, please call your pharmacy.     Thank you for choosing [facility] !         

## 2016-10-31 DIAGNOSIS — N183 Chronic kidney disease, stage 3 (moderate): Secondary | ICD-10-CM | POA: Diagnosis not present

## 2016-10-31 DIAGNOSIS — M1990 Unspecified osteoarthritis, unspecified site: Secondary | ICD-10-CM | POA: Diagnosis not present

## 2016-10-31 DIAGNOSIS — Z1389 Encounter for screening for other disorder: Secondary | ICD-10-CM | POA: Diagnosis not present

## 2016-10-31 DIAGNOSIS — I10 Essential (primary) hypertension: Secondary | ICD-10-CM | POA: Diagnosis not present

## 2016-10-31 DIAGNOSIS — E039 Hypothyroidism, unspecified: Secondary | ICD-10-CM | POA: Diagnosis not present

## 2016-10-31 DIAGNOSIS — E782 Mixed hyperlipidemia: Secondary | ICD-10-CM | POA: Diagnosis not present

## 2016-11-22 ENCOUNTER — Encounter (HOSPITAL_COMMUNITY): Payer: Self-pay | Admitting: Emergency Medicine

## 2016-11-22 ENCOUNTER — Emergency Department (HOSPITAL_COMMUNITY)
Admission: EM | Admit: 2016-11-22 | Discharge: 2016-11-22 | Disposition: A | Discharge status: Home / Self Care | Payer: Medicare Other | Attending: Emergency Medicine | Admitting: Emergency Medicine

## 2016-11-22 ENCOUNTER — Emergency Department (HOSPITAL_COMMUNITY): Payer: Medicare Other

## 2016-11-22 DIAGNOSIS — Z79899 Other long term (current) drug therapy: Secondary | ICD-10-CM | POA: Insufficient documentation

## 2016-11-22 DIAGNOSIS — M545 Low back pain, unspecified: Secondary | ICD-10-CM

## 2016-11-22 DIAGNOSIS — I10 Essential (primary) hypertension: Secondary | ICD-10-CM | POA: Insufficient documentation

## 2016-11-22 DIAGNOSIS — Z87891 Personal history of nicotine dependence: Secondary | ICD-10-CM | POA: Diagnosis not present

## 2016-11-22 DIAGNOSIS — E119 Type 2 diabetes mellitus without complications: Secondary | ICD-10-CM | POA: Insufficient documentation

## 2016-11-22 DIAGNOSIS — Z7982 Long term (current) use of aspirin: Secondary | ICD-10-CM | POA: Diagnosis not present

## 2016-11-22 MED ORDER — IBUPROFEN 800 MG PO TABS: 800.0000 mg | ORAL_TABLET | Freq: Three times a day (TID) | ORAL | 0 refills | Status: DC

## 2016-11-22 MED ORDER — IBUPROFEN 800 MG PO TABS
800.0000 mg | ORAL_TABLET | Freq: Once | ORAL | Status: AC
  Administered 2016-11-22: 800 mg via ORAL
  Filled 2016-11-22: qty 1

## 2016-11-22 MED ORDER — METHOCARBAMOL 500 MG PO TABS: 500.0000 mg | ORAL_TABLET | Freq: Two times a day (BID) | ORAL | 0 refills | Status: DC | PRN

## 2016-11-22 NOTE — ED Provider Notes (Signed)
[facility] DEPT Provider Note   CSN: [id number] Arrival date & time: 11/22/16  1214     History   Chief Complaint Chief Complaint  Patient presents with  . Back Pain    HPI [name] is a 71 y.o. male.  HPI  The patient is a 71 year old male, he has a known history of diabetes, high blood pressure, recurrent kidney stones and takes his medications daily as prescribed. He [place] he [place] and fell and landed on his lower back, he has had pain since that time which is constant, worse with certain movements and not associated with any numbness or weakness of the legs, not associated with urinary incontinence and the patient denies any fevers, cancer or IV drug use. He has been given medications for his chronic kidney stones in the past but does not currently take any opiate medications or any anti-inflammatories other than a baby aspirin. He locates his back pain in the lower back, more on the right side.  Past Medical History:  Diagnosis Date  . Diabetes mellitus   . HOH (hard of hearing)   . Hypertension   . Kidney stone   . Recurrent kidney stones     There are no active problems to display for this patient.   Past Surgical History:  Procedure Laterality Date  . COLONOSCOPY  07/15/2011   Procedure: COLONOSCOPY;  Surgeon: Jamesetta So, MD;  [facility];  Service: Gastroenterology;  Laterality: N/A;  . CYSTOSCOPY W/ RETROGRADES Right 01/25/2013   Procedure: CYSTOSCOPY WITH RIGHT RETROGRADE PYELOGRAM, BALLOON DILATION RIGHT URETER AND RIGHT URETERAL STENT PLACEMENT;  Surgeon: Marissa Nestle, MD;  Location: [facility];  Service: Urology;  Laterality: Right;  . CYSTOSCOPY W/ URETERAL STENT PLACEMENT Left 12/28/2012   Procedure: CYSTOSCOPY WITH RETROGRADE PYELOGRAM/LEFT URETERAL STENT PLACEMENT;  Surgeon: Marissa Nestle, MD;  Location: [facility];  Service: Urology;  Laterality: Left;  . CYSTOSCOPY W/ URETERAL  STENT REMOVAL Left 01/25/2013   Procedure: CYSTOSCOPY WITH LEFT URETERALSTENT REMOVAL;  Surgeon: Marissa Nestle, MD;  Location: [facility];  Service: Urology;  Laterality: Left;  . EXTRACORPOREAL SHOCK WAVE LITHOTRIPSY Right 01/05/2013   Procedure: EXTRACORPOREAL SHOCK WAVE LITHOTRIPSY (ESWL) RIGHT URETERAL CALCULUS;  Surgeon: Marissa Nestle, MD;  Location: [facility];  Service: Urology;  Laterality: Right;  . EXTRACORPOREAL SHOCK WAVE LITHOTRIPSY Right 02/02/2013   Procedure: EXTRACORPOREAL SHOCK WAVE LITHOTRIPSY (ESWL) RIGHT URETERAL CALCULUS;  Surgeon: Marissa Nestle, MD;  Location: [facility];  Service: Urology;  Laterality: Right;  . EXTRACORPOREAL SHOCK WAVE LITHOTRIPSY Right 05/25/2013   Procedure: EXTRACORPOREAL SHOCK WAVE LITHOTRIPSY (ESWL) RIGHT URETERAL CALCULUS;  Surgeon: Marissa Nestle, MD;  Location: [facility];  Service: Urology;  Laterality: Right;  . HOLMIUM LASER APPLICATION Left 40/10/8117   Procedure: HOLMIUM LASER APPLICATION;  Surgeon: Marissa Nestle, MD;  Location: [facility];  Service: Urology;  Laterality: Left;  . LITHOTRIPSY     X 2  . LITHOTRIPSY    . STONE EXTRACTION WITH BASKET Left 12/28/2012   Procedure: STONE EXTRACTION WITH BASKET;  Surgeon: Marissa Nestle, MD;  Location: [facility];  Service: Urology;  Laterality: Left;       Home Medications    Prior to Admission medications   Medication Sig Start Date End Date Taking? Authorizing Provider  amLODipine-valsartan (EXFORGE) 5-160 MG per tablet Take 1 tablet by mouth daily. Pt taking 1/2 tablet    [provider]  aspirin EC 81 MG tablet Take 81 mg by mouth daily.    [provider]  diazepam (VALIUM) 10 MG tablet Take 10 mg by mouth 3 (three) times daily as needed for anxiety.    [provider]  escitalopram (LEXAPRO) 5 MG tablet Take 5 mg by mouth daily.    [provider]  ibuprofen (ADVIL,MOTRIN) 800 MG tablet Take 1 tablet (800 mg total) by mouth 3 (three) times daily. 11/22/16    Noemi Chapel, MD  methocarbamol (ROBAXIN) 500 MG tablet Take 1 tablet (500 mg total) by mouth 2 (two) times daily as needed for muscle spasms. 11/22/16   Noemi Chapel, MD  nitroGLYCERIN (NITROSTAT) 0.4 MG SL tablet Place 1 tablet (0.4 mg total) under the tongue every 5 (five) minutes as needed. 12/04/15   Herminio Commons, MD  potassium chloride SA (K-DUR,KLOR-CON) 20 MEQ tablet Take 20 mEq by mouth daily. 01/24/14   [provider]  traMADol (ULTRAM) 50 MG tablet Take by mouth every 6 (six) hours as needed for moderate pain.    [provider]    Family History Family History  Problem Relation Age of Onset  . Stroke Mother   . Heart attack Father   . Colon cancer Paternal Uncle     Social History Social History  Substance Use Topics  . Smoking status: Former Smoker    Types: Cigarettes    Quit date: 02/25/1980  . Smokeless tobacco: Former Systems developer    Types: Snuff, Chew  . Alcohol use No     Allergies   Patient has no known allergies.   Review of Systems Review of Systems  Musculoskeletal: Positive for back pain.  Neurological: Negative for weakness and numbness.     Physical Exam Updated Vital Signs BP (!) 160/86 (BP Location: Right Arm)   Pulse 72   Temp 97.8 F (36.6 C) (Oral)   Resp 19   Wt 79.8 kg (176 lb)   SpO2 96%   BMI 26.76 kg/m   Physical Exam  Constitutional: He appears well-developed and well-nourished. No distress.  HENT:  Head: Normocephalic and atraumatic.  Mouth/Throat: Oropharynx is clear and moist. No oropharyngeal exudate.  Eyes: Conjunctivae are normal. Right eye exhibits no discharge. Left eye exhibits no discharge. No scleral icterus.  Neck: Normal range of motion. Neck supple. No JVD present. No thyromegaly present.  Cardiovascular: Normal rate and regular rhythm.   Pulmonary/Chest: Effort normal.  Musculoskeletal: Normal range of motion. He exhibits tenderness ( has some lower back ttp across the lower back both R  and L - there is no midline ttp). He exhibits no edema.  Lymphadenopathy:    He has no cervical adenopathy.  Neurological: He is alert. Coordination normal.  Able to SLR bilaterally - there is no numbness or weakness,  Normal gait  Skin: Skin is warm and dry. No rash noted. No erythema.  Psychiatric: He has a normal mood and affect. His behavior is normal.  Nursing note and vitals reviewed.    ED Treatments / Results  Labs (all labs ordered are listed, but only abnormal results are displayed) Labs Reviewed - No data to display   Radiology Dg Lumbar Spine Complete  Result Date: 11/22/2016 CLINICAL DATA:  71 year old male with a history of fall and lumbar back pain EXAM: LUMBAR SPINE - COMPLETE 4+ VIEW COMPARISON:  None. FINDINGS: Lumbar Spine: Lumbar vertebral elements maintain normal alignment without evidence of anterolisthesis, retrolisthesis, subluxation. Oblique images demonstrate no displaced pars defect. No  fracture line identified.  Vertebral body heights maintained. Mild degenerative changes of the lower lumbar spine. No significant facet disease. Calcifications of the aorta. IMPRESSION: Negative for acute fracture malalignment of the lumbar spine. Electronically Signed   By: Corrie Mckusick D.O.   On: 11/22/2016 13:57    Procedures Procedures (including critical care time)  Medications Ordered in ED Medications  ibuprofen (ADVIL,MOTRIN) tablet 800 mg (800 mg Oral Given 11/22/16 1309)     Initial Impression / Assessment and Plan / ED Course  I have reviewed the triage vital signs and the nursing notes.  Pertinent labs & imaging results that were available during my care of the patient were reviewed by me and considered in my medical decision making (see chart for details).    Has had a lower back pain that is no spinal There is no neuro symptoms Will evaluate for fractures with xrays Ibuprofen - pt is driving here.  xrays neg Pt stable for d/c Return precautions  given  Final Clinical Impressions(s) / ED Diagnoses   Final diagnoses:  Acute bilateral low back pain without sciatica    New Prescriptions New Prescriptions   IBUPROFEN (ADVIL,MOTRIN) 800 MG TABLET    Take 1 tablet (800 mg total) by mouth 3 (three) times daily.   METHOCARBAMOL (ROBAXIN) 500 MG TABLET    Take 1 tablet (500 mg total) by mouth 2 (two) times daily as needed for muscle spasms.     Noemi Chapel, MD 11/22/16 1409

## 2016-11-22 NOTE — Discharge Instructions (Signed)
Back Pain: ° ° °Your back pain should be treated with medicines such as ibuprofen or aleve and this back pain should get better over the next 2 weeks.  However if you develop severe or worsening pain, low back pain with fever, numbness, weakness or inability to walk or urinate, you should return to the ER immediately.  Please follow up with your doctor this week for a recheck if still having symptoms. °Low back pain is discomfort in the lower back that may be due to injuries to muscles and ligaments around the spine.  Occasionally, it may be caused by a a problem to a part of the spine called a disc.  The pain may last several days or a week;  However, most patients get completely well in 4 weeks. ° °Self - care:  The application of heat can help soothe the pain.  Maintaining your daily activities, including walking, is encourged, as it will help you get better faster than just staying in bed. ° °Medications are also useful to help with pain control.  A commonly prescribed medications includes acetaminophen.  This medication is generally safe, though you should not take more than 8 of the extra strength (500mg) pills a day. ° °Non steroidal anti inflammatory medications including Ibuprofen and naproxen;  These medications help both pain and swelling and are very useful in treating back pain.  They should be taken with food, as they can cause stomach upset, and more seriously, stomach bleeding.   ° °Muscle relaxants:  These medications can help with muscle tightness that is a cause of lower back pain.  Most of these medications can cause drowsiness, and it is not safe to drive or use dangerous machinery while taking them. ° °You will need to follow up with  Your primary healthcare provider in 1-2 weeks for reassessment. ° °Be aware that if you develop new symptoms, such as a fever, leg weakness, difficulty with or loss of control of your urine or bowels, abdominal pain, or more severe pain, you will need to seek  medical attention and  / or return to the Emergency department. ° °If you do not have a doctor see the list below. ° °RESOURCE GUIDE ° °Chronic Pain Problems: °Contact [facility]  [phone] °Patients need to be referred by their primary care doctor. ° °Insufficient Money for Medicine: °Contact [employer]:  call "211" or [facility] 271-5999. ° °No Primary Care Doctor: °- Call [facility]  832-8000 - can help you locate a primary care doctor that  accepts your insurance, provides certain services, etc. °- Physician Referral Service- 1-800-533-3463 ° °Agencies that provide inexpensive medical care: °- [facility]  [phone] °- [facility]  [phone] °- [facility]  [phone] °- [facility]  832-4777 °- [facility]  [phone] °- [facility]  [phone] ° °Medicaid-accepting [place] Providers: °- [facility]- [address] ° [phone], Mon-Fri 9am-7pm, Sat 9am-1pm °- [facility]- [address], [address] ° [phone] °- [facility]- [address], [address] ° [phone] °- [facility]- [address] [place] [address] ° [phone] °- [facility]- [address], [phone] ° Only accepts [place] Access Medicaid patients after they have their name  applied to their card ° °Self Pay (no insurance) in [place]: °- Sickle Cell Patients: Dr Eric Dean, [facility] °   [address], [phone] °- [facility]- [address] ° [phone] °      -     [facility]- [address] °      -     [facility]- see information above (Speak to Pam H if you do not have insurance) °      -  [facility]- [address], [phone] °      -  [facility] [place]- [address],  [phone] °      -  [facility]- [address] [place] [address],  [phone] °      -  Dr Osei-Bonsu-  [address], [place], [phone] °      -  [facility]- [address], [phone] °      -  [facility]- [address] [place] [address], [phone], also [address], [phone] °      -    [facility]- [address], [phone], 1st & 3rd Saturday   every month, 10am-1pm ° °1) Find a Doctor and Pay Out of Pocket °Although you won't have to find out who is covered by your insurance plan, it is a good idea to ask around and get recommendations. You will then need to call the office and see if the doctor you have chosen will accept you as a new patient and what types of options they offer for patients who are self-pay. Some doctors offer discounts or will set up payment plans for their patients who do not have insurance, but you will need to ask so you aren't surprised when you get to your appointment. ° °2) Contact Your Local Health Department °Not all health departments have doctors that can see patients for sick visits, but many do, so it is worth a call to see if yours does. If you don't know where your local health department is, you can check in your phone book. The CDC also has a tool to help you locate your state's health department, and many state websites also have listings of all of their local health departments. ° °3) Find a Walk-in Clinic °If your illness is not likely to be very severe or complicated, you may want to try a walk in clinic. These are popping up all over the country in pharmacies, drugstores, and shopping centers. They're usually staffed by nurse practitioners or physician assistants that have been trained to treat common illnesses and complaints. They're usually fairly quick and inexpensive. However, if you have serious medical issues or chronic medical problems, these are probably not your best option ° °STD Testing °- [place] [facility] [place], [facility], [address], [place], phone [phone] or 1-877-539-9860.  Monday - Friday, call for an appointment. °- [place] [facility] [place], [facility], [address], [place], phone [phone] or 1-877-539-9860.  Monday - Friday, call for an appointment. ° °Abuse/Neglect: °- [place] Child Abuse Hotline (336) 641-3795 °- [place] Child Abuse Hotline 800-378-5315 (After Hours) ° °Emergency Shelter:  [facility] (336) 271-5985 ° °Maternity Homes: °- [facility] (336) 275-9566 °- [facility] (704) 372-4663 ° °MRSA Hotline #:   832-7006 ° °[place] Resources ° °[facility] [place]  [employer] [place] [facility] °[address]                 [address]         [address]  °[place]                                               [place]                              [place] °Phone:  349-3220                                  Phone:  [phone]                   Phone:  342-8140 ° °[facility], 342-8316 °- [place] [facility] - [facility]- 1-888-581-9988 °      -     [facility] in [place], [address],                                  336-349-4454, Insurance ° °[place] Child Abuse Hotline °(336) 342-1394 or (336) 342-3537 (After Hours) ° ° °Behavioral Health Services ° °Substance Abuse Resources: °- [facility]  336-882-2125 °- [facility] 336-784-9470 °- [facility] 336-285-9073 °- [facility] 336-845-3988 °- Residential & Outpatient Substance Abuse Program  800-659-3381 ° °Psychological Services: °- [facility]  832-9600 °- [facility]  378-7881 °- [facility], [address], [place], ACCESS LINE: 1-800-853-5163 or 336-641-4981, Http://www.[facility]/services/adult.htm ° °Dental Assistance ° °If unable to pay or  uninsured, contact:  [facility] or [facility] to become qualified for the adult dental clinic. ° °Patients with Medicaid: [facility] °[address], [phone] °[address], [phone] ° °If unable to pay, or uninsured, contact [facility] (271-5999) or [place] (641-3152 in [place], 842-7733 in [place]) to become qualified for the adult dental clinic ° °Other Low-Cost Community Dental Services: °- [facility]- [address], [place], [place], [address], 723-1848, Ext. [address], 2nd and 4th Thursday of the month at 6:30am.  10 clients each day by appointment, can sometimes see walk-in patients if someone does not show for an appointment. °- [facility]- [address], [place], [place], [address], [phone] °- [facility]- [address], [place], [place], [address], [phone] °- [facility]- 342-8273 °- [facility]- [phone] °- [facility]- 570-6415 ° ° ° ° ° ° °

## 2016-11-22 NOTE — ED Triage Notes (Signed)
Pt fell at [employer] yesterday, complaining of low back pain

## 2016-11-24 ENCOUNTER — Other Ambulatory Visit: Payer: Self-pay

## 2016-12-24 ENCOUNTER — Telehealth: Payer: Self-pay

## 2016-12-24 ENCOUNTER — Encounter: Payer: Self-pay | Admitting: Cardiovascular Disease

## 2016-12-24 ENCOUNTER — Other Ambulatory Visit: Payer: Self-pay | Admitting: Cardiovascular Disease

## 2016-12-24 ENCOUNTER — Other Ambulatory Visit (HOSPITAL_COMMUNITY)
Admission: RE | Admit: 2016-12-24 | Discharge: 2016-12-24 | Disposition: A | Discharge status: Home / Self Care | Payer: Medicare Other | Source: Ambulatory Visit | Attending: Cardiovascular Disease | Admitting: Cardiovascular Disease

## 2016-12-24 ENCOUNTER — Ambulatory Visit (HOSPITAL_COMMUNITY)
Admission: RE | Admit: 2016-12-24 | Discharge: 2016-12-24 | Disposition: A | Discharge status: Home / Self Care | Payer: Medicare Other | Source: Ambulatory Visit | Attending: Cardiovascular Disease | Admitting: Cardiovascular Disease

## 2016-12-24 ENCOUNTER — Ambulatory Visit (INDEPENDENT_AMBULATORY_CARE_PROVIDER_SITE_OTHER): Payer: Medicare Other | Admitting: Cardiovascular Disease

## 2016-12-24 VITALS — BP 146/84 | HR 67 | Ht 68.0 in | Wt 185.0 lb

## 2016-12-24 DIAGNOSIS — Z01818 Encounter for other preprocedural examination: Secondary | ICD-10-CM | POA: Diagnosis not present

## 2016-12-24 DIAGNOSIS — Z136 Encounter for screening for cardiovascular disorders: Secondary | ICD-10-CM | POA: Diagnosis not present

## 2016-12-24 DIAGNOSIS — Z23 Encounter for immunization: Secondary | ICD-10-CM | POA: Diagnosis not present

## 2016-12-24 DIAGNOSIS — I209 Angina pectoris, unspecified: Secondary | ICD-10-CM

## 2016-12-24 DIAGNOSIS — R072 Precordial pain: Secondary | ICD-10-CM | POA: Diagnosis not present

## 2016-12-24 DIAGNOSIS — I10 Essential (primary) hypertension: Secondary | ICD-10-CM

## 2016-12-24 DIAGNOSIS — Z79899 Other long term (current) drug therapy: Secondary | ICD-10-CM | POA: Diagnosis not present

## 2016-12-24 DIAGNOSIS — R079 Chest pain, unspecified: Secondary | ICD-10-CM | POA: Insufficient documentation

## 2016-12-24 LAB — BASIC METABOLIC PANEL
ANION GAP: 10 (ref 5–15)
BUN: 14 mg/dL (ref 6–20)
CHLORIDE: 101 mmol/L (ref 101–111)
CO2: 26 mmol/L (ref 22–32)
CREATININE: 1.23 mg/dL (ref 0.61–1.24)
Calcium: 9.2 mg/dL (ref 8.9–10.3)
GFR calc non Af Amer: 56 mL/min — ABNORMAL LOW (ref >60)
Glucose, Bld: 174 mg/dL — ABNORMAL HIGH (ref 65–99)
POTASSIUM: 3.4 mmol/L — AB (ref 3.5–5.1)
SODIUM: 137 mmol/L (ref 135–145)

## 2016-12-24 LAB — CBC WITH DIFFERENTIAL/PLATELET
Basophils Absolute: 0 10*3/uL (ref 0.0–0.1)
Basophils Relative: 0 %
EOS ABS: 0 10*3/uL (ref 0.0–0.7)
Eosinophils Relative: 1 %
HCT: 42.4 % (ref 39.0–52.0)
HEMOGLOBIN: 14.6 g/dL (ref 13.0–17.0)
LYMPHS ABS: 1 10*3/uL (ref 0.7–4.0)
Lymphocytes Relative: 15 %
MCH: 31.7 pg (ref 26.0–34.0)
MCHC: 34.4 g/dL (ref 30.0–36.0)
MCV: 92.2 fL (ref 78.0–100.0)
Monocytes Absolute: 0.3 10*3/uL (ref 0.1–1.0)
Monocytes Relative: 5 %
NEUTROS ABS: 5.2 10*3/uL (ref 1.7–7.7)
NEUTROS PCT: 79 %
Platelets: 180 10*3/uL (ref 150–400)
RBC: 4.6 MIL/uL (ref 4.22–5.81)
RDW: 14.5 % (ref 11.5–15.5)
WBC: 6.5 10*3/uL (ref 4.0–10.5)

## 2016-12-24 LAB — PROTIME-INR
INR: 0.98
Prothrombin Time: 12.9 seconds (ref 11.4–15.2)

## 2016-12-24 MED ORDER — POTASSIUM CHLORIDE CRYS ER 20 MEQ PO TBCR: 40.0000 meq | EXTENDED_RELEASE_TABLET | Freq: Every day | ORAL | 3 refills | Status: DC

## 2016-12-24 NOTE — Telephone Encounter (Signed)
Called pt. No answer. Left message for pt to return call.  

## 2016-12-24 NOTE — Telephone Encounter (Signed)
-----   Message from Herminio Commons, MD sent at 12/24/2016  1:48 PM EDT ----- Normal

## 2016-12-24 NOTE — H&P (View-Only) (Signed)
SUBJECTIVE: The patient presents for follow-up of chest pain and hypertension. Exercise treadmill stress test 10/16/15 did not show ischemic changes but was deemed intermediate risk due to limited exercise duration.  ECG performed in the office today which I ordered and personally interpreted demonstrated sinus rhythm with nonspecific T wave abnormalities.  He tells me he has not had chest pain for 3 weeks.  He sometimes is able to walk 10-12 miles without difficulty.  However, when he is raking heavier piles of leaves here he experiences retrosternal chest discomfort which he describes as a "toothache pain ".  This also sometimes occurs when he lifts heavier objects.    His brother recently died of an MI at age 5.  His father died of an MI at age 51.  His mother had a history of strokes.  Blood pressure is mildly elevated in our office today but has been reportedly normal at his PCPs office whom he sees every 3 months.    Review of Systems: As per "subjective", otherwise negative.  No Known Allergies  Current Outpatient Prescriptions  Medication Sig Dispense Refill  . amLODipine-valsartan (EXFORGE) 5-160 MG per tablet Take 1 tablet by mouth daily. Pt taking 1/2 tablet    . aspirin EC 81 MG tablet Take 81 mg by mouth daily.    . diazepam (VALIUM) 10 MG tablet Take 10 mg by mouth 3 (three) times daily as needed for anxiety.    Marland Kitchen escitalopram (LEXAPRO) 5 MG tablet Take 5 mg by mouth daily.    . nitroGLYCERIN (NITROSTAT) 0.4 MG SL tablet Place 1 tablet (0.4 mg total) under the tongue every 5 (five) minutes as needed. 25 tablet 3  . potassium chloride SA (K-DUR,KLOR-CON) 20 MEQ tablet Take 20 mEq by mouth daily.  2  . traMADol (ULTRAM) 50 MG tablet Take by mouth every 6 (six) hours as needed for moderate pain.     No current facility-administered medications for this visit.     Past Medical History:  Diagnosis Date  . Diabetes mellitus   . HOH (hard of hearing)   . Hypertension    . Kidney stone   . Recurrent kidney stones     Past Surgical History:  Procedure Laterality Date  . COLONOSCOPY  07/15/2011   Procedure: COLONOSCOPY;  Surgeon: Jamesetta So, MD;  [facility];  Service: Gastroenterology;  Laterality: N/A;  . CYSTOSCOPY W/ RETROGRADES Right 01/25/2013   Procedure: CYSTOSCOPY WITH RIGHT RETROGRADE PYELOGRAM, BALLOON DILATION RIGHT URETER AND RIGHT URETERAL STENT PLACEMENT;  Surgeon: Marissa Nestle, MD;  Location: [facility];  Service: Urology;  Laterality: Right;  . CYSTOSCOPY W/ URETERAL STENT PLACEMENT Left 12/28/2012   Procedure: CYSTOSCOPY WITH RETROGRADE PYELOGRAM/LEFT URETERAL STENT PLACEMENT;  Surgeon: Marissa Nestle, MD;  Location: [facility];  Service: Urology;  Laterality: Left;  . CYSTOSCOPY W/ URETERAL STENT REMOVAL Left 01/25/2013   Procedure: CYSTOSCOPY WITH LEFT URETERALSTENT REMOVAL;  Surgeon: Marissa Nestle, MD;  Location: [facility];  Service: Urology;  Laterality: Left;  . EXTRACORPOREAL SHOCK WAVE LITHOTRIPSY Right 01/05/2013   Procedure: EXTRACORPOREAL SHOCK WAVE LITHOTRIPSY (ESWL) RIGHT URETERAL CALCULUS;  Surgeon: Marissa Nestle, MD;  Location: [facility];  Service: Urology;  Laterality: Right;  . EXTRACORPOREAL SHOCK WAVE LITHOTRIPSY Right 02/02/2013   Procedure: EXTRACORPOREAL SHOCK WAVE LITHOTRIPSY (ESWL) RIGHT URETERAL CALCULUS;  Surgeon: Marissa Nestle, MD;  [facility]: [facility];  Service: Urology;  Laterality: Right;  . EXTRACORPOREAL SHOCK WAVE LITHOTRIPSY Right 05/25/2013  Procedure: EXTRACORPOREAL SHOCK WAVE LITHOTRIPSY (ESWL) RIGHT URETERAL CALCULUS;  Surgeon: Marissa Nestle, MD;  [facility];  Service: Urology;  Laterality: Right;  . HOLMIUM LASER APPLICATION Left 30/02/6008   Procedure: HOLMIUM LASER APPLICATION;  Surgeon: Marissa Nestle, MD;  Location: [facility];  Service: Urology;  Laterality: Left;  . LITHOTRIPSY     X 2  . LITHOTRIPSY    . STONE EXTRACTION WITH BASKET Left 12/28/2012   Procedure: STONE  EXTRACTION WITH BASKET;  Surgeon: Marissa Nestle, MD;  Location: [facility];  Service: Urology;  Laterality: Left;    Social History   Social History  . Marital status: Single    Spouse name: N/A  . Number of children: N/A  . Years of education: N/A   Occupational History  . Not on file.   Social History Main Topics  . Smoking status: Former Smoker    Types: Cigarettes    Quit date: 02/25/1980  . Smokeless tobacco: Former Systems developer    Types: Snuff, Chew  . Alcohol use No  . Drug use: No  . Sexual activity: Not Currently   Other Topics Concern  . Not on file   Social History Narrative   ** Merged History Encounter **         Vitals:   12/24/16 0855  BP: (!) 146/84  Pulse: 67  SpO2: 93%  Weight: 185 lb (83.9 kg)  Height: 5\' 8"  (1.727 m)    Wt Readings from Last 3 Encounters:  12/24/16 185 lb (83.9 kg)  11/22/16 176 lb (79.8 kg)  06/24/16 176 lb (79.8 kg)     PHYSICAL EXAM General: NAD HEENT: Normal. Neck: No JVD, no thyromegaly. Lungs: Clear to auscultation bilaterally with normal respiratory effort. CV: Regular rate and rhythm, normal S1/S2, no X3/A3, soft 1/6 systolic murmur over RUSB. No pretibial or periankle edema.  No carotid bruit.   Abdomen: Soft, nontender, no distention.  Neurologic: Alert and oriented.  Psych: Normal affect. Skin: Normal. Musculoskeletal: No gross deformities.    ECG: Most recent ECG reviewed.   Labs: Lab Results  Component Value Date/Time   K 3.6 09/15/2015 12:18 PM   K 3.7 11/17/2013 05:52 PM   BUN 14 09/15/2015 12:18 PM   BUN 11 11/17/2013 05:52 PM   CREATININE 1.35 (H) 09/15/2015 12:18 PM   CREATININE 1.37 (H) 11/17/2013 05:52 PM   ALT 8 (L) 09/15/2015 12:18 PM   ALT 13 (L) 11/17/2013 05:52 PM   TSH 3.86 11/17/2013 05:52 PM   HGB 15.3 09/15/2015 12:18 PM   HGB 13.1 11/17/2013 05:52 PM     Lipids: No results found for: LDLCALC, LDLDIRECT, CHOL, TRIG, HDL     ASSESSMENT AND PLAN:  1. Chest pain (angina  pectoris): GXT on 10/16/15 demonstrated a hypertensive response to exercise.  There were no diagnostic ST segment changes to indicate ischemia but Duke treadmill score was intermediate due to exercise duration.  He had no chest pain during the stress test at that time. However, it appears he is having more frequent symptoms.  He has a strong family history of heart disease.  His brother recently died of an MI at age 33 and his father died of an MI at age 23.  I am concerned that his symptoms represent ischemic heart disease. I will proceed with coronary angiography. Continue aspirin.  I will obtain a copy of lipids from PCP. Risks and benefits of cardiac catheterization have been discussed with the patient.  These include bleeding,  infection, kidney damage, stroke, heart attack, death.  The patient understands these risks and is willing to proceed.  2. Hypertension: Remains mildly elevated. Had previously been taking a higher dose of antihypertensive therapy but blood pressure became too low and PCP reduced the dose.   His blood pressure has been reportedly normal at his PCPs office within the past 6 months.  I will continue to monitor this.    Disposition: Follow up 1 month  Time spent: 40 minutes, of which greater than 50% was spent reviewing symptoms, relevant blood tests and studies, and discussing management plan with the patient.    Kate Sable, M.D., F.A.C.C.

## 2016-12-24 NOTE — Progress Notes (Signed)
SUBJECTIVE: The patient presents for follow-up of chest pain and hypertension. Exercise treadmill stress test 10/16/15 did not show ischemic changes but was deemed intermediate risk due to limited exercise duration.  ECG performed in the office today which I ordered and personally interpreted demonstrated sinus rhythm with nonspecific T wave abnormalities.  He tells me he has not had chest pain for 3 weeks.  He sometimes is able to walk 10-12 miles without difficulty.  However, when he is raking heavier piles of leaves here he experiences retrosternal chest discomfort which he describes as a "toothache pain ".  This also sometimes occurs when he lifts heavier objects.    His brother recently died of an MI at age 21.  His father died of an MI at age 34.  His mother had a history of strokes.  Blood pressure is mildly elevated in our office today but has been reportedly normal at his PCPs office whom he sees every 3 months.    Review of Systems: As per "subjective", otherwise negative.  No Known Allergies  Current Outpatient Prescriptions  Medication Sig Dispense Refill  . amLODipine-valsartan (EXFORGE) 5-160 MG per tablet Take 1 tablet by mouth daily. Pt taking 1/2 tablet    . aspirin EC 81 MG tablet Take 81 mg by mouth daily.    . diazepam (VALIUM) 10 MG tablet Take 10 mg by mouth 3 (three) times daily as needed for anxiety.    Marland Kitchen escitalopram (LEXAPRO) 5 MG tablet Take 5 mg by mouth daily.    . nitroGLYCERIN (NITROSTAT) 0.4 MG SL tablet Place 1 tablet (0.4 mg total) under the tongue every 5 (five) minutes as needed. 25 tablet 3  . potassium chloride SA (K-DUR,KLOR-CON) 20 MEQ tablet Take 20 mEq by mouth daily.  2  . traMADol (ULTRAM) 50 MG tablet Take by mouth every 6 (six) hours as needed for moderate pain.     No current facility-administered medications for this visit.     Past Medical History:  Diagnosis Date  . Diabetes mellitus   . HOH (hard of hearing)   . Hypertension    . Kidney stone   . Recurrent kidney stones     Past Surgical History:  Procedure Laterality Date  . COLONOSCOPY  07/15/2011   Procedure: COLONOSCOPY;  Surgeon: Jamesetta So, MD;  [facility];  Service: Gastroenterology;  Laterality: N/A;  . CYSTOSCOPY W/ RETROGRADES Right 01/25/2013   Procedure: CYSTOSCOPY WITH RIGHT RETROGRADE PYELOGRAM, BALLOON DILATION RIGHT URETER AND RIGHT URETERAL STENT PLACEMENT;  Surgeon: Marissa Nestle, MD;  [facility];  Service: Urology;  Laterality: Right;  . CYSTOSCOPY W/ URETERAL STENT PLACEMENT Left 12/28/2012   Procedure: CYSTOSCOPY WITH RETROGRADE PYELOGRAM/LEFT URETERAL STENT PLACEMENT;  Surgeon: Marissa Nestle, MD;  Location[facility];  Service: Urology;  Laterality: Left;  . CYSTOSCOPY W/ URETERAL STENT REMOVAL Left 01/25/2013   Procedure: CYSTOSCOPY WITH LEFT URETERALSTENT REMOVAL;  Surgeon: Marissa Nestle, MD;  [facility];  Service: Urology;  Laterality: Left;  . EXTRACORPOREAL SHOCK WAVE LITHOTRIPSY Right 01/05/2013   Procedure: EXTRACORPOREAL SHOCK WAVE LITHOTRIPSY (ESWL) RIGHT URETERAL CALCULUS;  Surgeon: Marissa Nestle, MD;  [facility];  Service: Urology;  Laterality: Right;  . EXTRACORPOREAL SHOCK WAVE LITHOTRIPSY Right 02/02/2013   Procedure: EXTRACORPOREAL SHOCK WAVE LITHOTRIPSY (ESWL) RIGHT URETERAL CALCULUS;  Surgeon: Marissa Nestle, MD;  [facility];  Service: Urology;  Laterality: Right;  . EXTRACORPOREAL SHOCK WAVE LITHOTRIPSY Right 05/25/2013  Procedure: EXTRACORPOREAL SHOCK WAVE LITHOTRIPSY (ESWL) RIGHT URETERAL CALCULUS;  Surgeon: Marissa Nestle, MD;  [facility];  Service: Urology;  Laterality: Right;  . HOLMIUM LASER APPLICATION Left 36/02/4429   Procedure: HOLMIUM LASER APPLICATION;  Surgeon: Marissa Nestle, MD;  Location: [facility];  Service: Urology;  Laterality: Left;  . LITHOTRIPSY     X 2  . LITHOTRIPSY    . STONE EXTRACTION WITH BASKET Left 12/28/2012   Procedure: STONE  EXTRACTION WITH BASKET;  Surgeon: Marissa Nestle, MD;  Location: [facility];  Service: Urology;  Laterality: Left;    Social History   Social History  . Marital status: Single    Spouse name: N/A  . Number of children: N/A  . Years of education: N/A   Occupational History  . Not on file.   Social History Main Topics  . Smoking status: Former Smoker    Types: Cigarettes    Quit date: 02/25/1980  . Smokeless tobacco: Former Systems developer    Types: Snuff, Chew  . Alcohol use No  . Drug use: No  . Sexual activity: Not Currently   Other Topics Concern  . Not on file   Social History Narrative   ** Merged History Encounter **         Vitals:   12/24/16 0855  BP: (!) 146/84  Pulse: 67  SpO2: 93%  Weight: 185 lb (83.9 kg)  Height: 5\' 8"  (1.727 m)    Wt Readings from Last 3 Encounters:  12/24/16 185 lb (83.9 kg)  11/22/16 176 lb (79.8 kg)  06/24/16 176 lb (79.8 kg)     PHYSICAL EXAM General: NAD HEENT: Normal. Neck: No JVD, no thyromegaly. Lungs: Clear to auscultation bilaterally with normal respiratory effort. CV: Regular rate and rhythm, normal S1/S2, no V4/M0, soft 1/6 systolic murmur over RUSB. No pretibial or periankle edema.  No carotid bruit.   Abdomen: Soft, nontender, no distention.  Neurologic: Alert and oriented.  Psych: Normal affect. Skin: Normal. Musculoskeletal: No gross deformities.    ECG: Most recent ECG reviewed.   Labs: Lab Results  Component Value Date/Time   K 3.6 09/15/2015 12:18 PM   K 3.7 11/17/2013 05:52 PM   BUN 14 09/15/2015 12:18 PM   BUN 11 11/17/2013 05:52 PM   CREATININE 1.35 (H) 09/15/2015 12:18 PM   CREATININE 1.37 (H) 11/17/2013 05:52 PM   ALT 8 (L) 09/15/2015 12:18 PM   ALT 13 (L) 11/17/2013 05:52 PM   TSH 3.86 11/17/2013 05:52 PM   HGB 15.3 09/15/2015 12:18 PM   HGB 13.1 11/17/2013 05:52 PM     Lipids: No results found for: LDLCALC, LDLDIRECT, CHOL, TRIG, HDL     ASSESSMENT AND PLAN:  1. Chest pain (angina  pectoris): GXT on 10/16/15 demonstrated a hypertensive response to exercise.  There were no diagnostic ST segment changes to indicate ischemia but Duke treadmill score was intermediate due to exercise duration.  He had no chest pain during the stress test at that time. However, it appears he is having more frequent symptoms.  He has a strong family history of heart disease.  His brother recently died of an MI at age 19 and his father died of an MI at age 72.  I am concerned that his symptoms represent ischemic heart disease. I will proceed with coronary angiography. Continue aspirin.  I will obtain a copy of lipids from PCP. Risks and benefits of cardiac catheterization have been discussed with the patient.  These include bleeding,  infection, kidney damage, stroke, heart attack, death.  The patient understands these risks and is willing to proceed.  2. Hypertension: Remains mildly elevated. Had previously been taking a higher dose of antihypertensive therapy but blood pressure became too low and PCP reduced the dose.   His blood pressure has been reportedly normal at his PCPs office within the past 6 months.  I will continue to monitor this.    Disposition: Follow up 1 month  Time spent: 40 minutes, of which greater than 50% was spent reviewing symptoms, relevant blood tests and studies, and discussing management plan with the patient.    Kate Sable, M.D., F.A.C.C.

## 2016-12-24 NOTE — Telephone Encounter (Signed)
-----   Message from Herminio Commons, MD sent at 12/24/2016 12:18 PM EDT ----- K is mildly low. If he is taking 20 meq of KCl daily, increase to 40 meq daily and check BMET in one week.

## 2016-12-24 NOTE — Telephone Encounter (Signed)
Pt made aware. Will repeat labs in 1 week. Pt will increase k to 40 mg bid.

## 2016-12-24 NOTE — Patient Instructions (Signed)
Medication Instructions:  Your physician recommends that you continue on your current medications as directed. Please refer to the Current Medication list given to you today.   Labwork: I will request labs from Dr. Hilma Favors   ASAP BMET CBC INR   Testing/Procedures: Your physician has requested that you have a cardiac catheterization. Cardiac catheterization is used to diagnose and/or treat various heart conditions. Doctors may recommend this procedure for a number of different reasons. The most common reason is to evaluate chest pain. Chest pain can be a symptom of coronary artery disease (CAD), and cardiac catheterization can show whether plaque is narrowing or blocking your heart's arteries. This procedure is also used to evaluate the valves, as well as measure the blood flow and oxygen levels in different parts of your heart. For further information please visit [email or web address]. Please follow instruction sheet, as given.  A chest x-ray takes a picture of the organs and structures inside the chest, including the heart, lungs, and blood vessels. This test can show several things, including, whether the heart is enlarges; whether fluid is building up in the lungs; and whether pacemaker / defibrillator leads are still in place.   Follow-Up: Your physician recommends that you schedule a follow-up appointment in: 1 MONTH    Any Other Special Instructions Will Be Listed Below (If Applicable).     If you need a refill on your cardiac medications before your next appointment, please call your pharmacy.    [place] [address] Dept: [phone] Loc: [phone]  [name]  12/24/2016  You are scheduled for a Cardiac Catheterization on Friday, November 9 with Dr. [name].  1. Please arrive at the [facility] (Main Entrance A) at [facility]: [place], [place] [address] at 10:00 AM (two hours before your procedure to ensure your preparation). Free valet parking service is available.   Special note: Every effort is made to have your procedure done on time. Please understand that emergencies sometimes delay scheduled procedures.  2. Diet: Do not eat or drink anything after midnight prior to your procedure except sips of water to take medications.  3. Labs: TODAY - [place]   PLEASE HAVE CHEST X-RAY DONE TODAY ALSO   4. Medication instructions in preparation for your procedure:   On the morning of your procedure, take your Aspirin and any morning medicines NOT listed above.  You may use sips of water.  5. Plan for one night stay--bring personal belongings. 6. Bring a current list of your medications and current insurance cards. 7. You MUST have a responsible person to drive you home. 8. Someone MUST be with you the first 24 hours after you arrive home or your discharge will be delayed. 9. Please wear clothes that are easy to get on and off and wear slip-on shoes.  Thank you for allowing [place] to care for you!   -- [facility]

## 2016-12-31 ENCOUNTER — Other Ambulatory Visit (HOSPITAL_COMMUNITY)
Admission: RE | Admit: 2016-12-31 | Discharge: 2016-12-31 | Disposition: A | Discharge status: Home / Self Care | Payer: Medicare Other | Source: Ambulatory Visit | Attending: Cardiovascular Disease | Admitting: Cardiovascular Disease

## 2016-12-31 DIAGNOSIS — Z79899 Other long term (current) drug therapy: Secondary | ICD-10-CM | POA: Diagnosis not present

## 2016-12-31 LAB — BASIC METABOLIC PANEL
Anion gap: 9 (ref 5–15)
BUN: 12 mg/dL (ref 6–20)
CALCIUM: 9.1 mg/dL (ref 8.9–10.3)
CO2: 27 mmol/L (ref 22–32)
CREATININE: 1.14 mg/dL (ref 0.61–1.24)
Chloride: 101 mmol/L (ref 101–111)
GFR calc Af Amer: >60 mL/min (ref >60)
GFR calc non Af Amer: >60 mL/min (ref >60)
GLUCOSE: 94 mg/dL (ref 65–99)
Potassium: 3.7 mmol/L (ref 3.5–5.1)
Sodium: 137 mmol/L (ref 135–145)

## 2017-01-01 ENCOUNTER — Telehealth: Payer: Self-pay

## 2017-01-01 NOTE — Telephone Encounter (Signed)
Left generic message requesting call back to discuss heart procedure.  This nurse name and # left for call back.

## 2017-01-02 ENCOUNTER — Encounter (HOSPITAL_COMMUNITY)
Admission: RE | Disposition: A | Discharge status: Home / Self Care | Payer: Self-pay | Source: Ambulatory Visit | Attending: Cardiovascular Disease

## 2017-01-02 ENCOUNTER — Ambulatory Visit (HOSPITAL_COMMUNITY)
Admission: RE | Admit: 2017-01-02 | Discharge: 2017-01-03 | Disposition: A | Discharge status: Home / Self Care | Payer: Medicare Other | Source: Ambulatory Visit | Attending: Cardiovascular Disease | Admitting: Cardiovascular Disease

## 2017-01-02 ENCOUNTER — Encounter (HOSPITAL_COMMUNITY): Payer: Self-pay

## 2017-01-02 DIAGNOSIS — I2582 Chronic total occlusion of coronary artery: Secondary | ICD-10-CM | POA: Diagnosis not present

## 2017-01-02 DIAGNOSIS — I2584 Coronary atherosclerosis due to calcified coronary lesion: Secondary | ICD-10-CM | POA: Diagnosis not present

## 2017-01-02 DIAGNOSIS — I209 Angina pectoris, unspecified: Secondary | ICD-10-CM

## 2017-01-02 DIAGNOSIS — Z7982 Long term (current) use of aspirin: Secondary | ICD-10-CM | POA: Diagnosis not present

## 2017-01-02 DIAGNOSIS — Z8249 Family history of ischemic heart disease and other diseases of the circulatory system: Secondary | ICD-10-CM | POA: Diagnosis not present

## 2017-01-02 DIAGNOSIS — Z955 Presence of coronary angioplasty implant and graft: Secondary | ICD-10-CM

## 2017-01-02 DIAGNOSIS — E7800 Pure hypercholesterolemia, unspecified: Secondary | ICD-10-CM | POA: Diagnosis not present

## 2017-01-02 DIAGNOSIS — Z87891 Personal history of nicotine dependence: Secondary | ICD-10-CM | POA: Insufficient documentation

## 2017-01-02 DIAGNOSIS — E119 Type 2 diabetes mellitus without complications: Secondary | ICD-10-CM | POA: Diagnosis not present

## 2017-01-02 DIAGNOSIS — I10 Essential (primary) hypertension: Secondary | ICD-10-CM | POA: Insufficient documentation

## 2017-01-02 DIAGNOSIS — I25118 Atherosclerotic heart disease of native coronary artery with other forms of angina pectoris: Secondary | ICD-10-CM | POA: Diagnosis present

## 2017-01-02 HISTORY — PX: CORONARY STENT INTERVENTION: CATH118234

## 2017-01-02 HISTORY — PX: LEFT HEART CATH AND CORONARY ANGIOGRAPHY: CATH118249

## 2017-01-02 HISTORY — PX: INTRAVASCULAR PRESSURE WIRE/FFR STUDY: CATH118243

## 2017-01-02 LAB — POCT ACTIVATED CLOTTING TIME: ACTIVATED CLOTTING TIME: 235 s

## 2017-01-02 LAB — GLUCOSE, CAPILLARY
GLUCOSE-CAPILLARY: 81 mg/dL (ref 65–99)
GLUCOSE-CAPILLARY: 94 mg/dL (ref 65–99)

## 2017-01-02 SURGERY — LEFT HEART CATH AND CORONARY ANGIOGRAPHY
Anesthesia: LOCAL

## 2017-01-02 MED ORDER — ONDANSETRON HCL 4 MG/2ML IJ SOLN: 4.0000 mg | Freq: Four times a day (QID) | INTRAMUSCULAR | Status: DC | PRN

## 2017-01-02 MED ORDER — LIDOCAINE HCL (PF) 1 % IJ SOLN
INTRAMUSCULAR | Status: AC
  Filled 2017-01-02: qty 30

## 2017-01-02 MED ORDER — SODIUM CHLORIDE 0.9% FLUSH: 3.0000 mL | INTRAVENOUS | Status: DC | PRN

## 2017-01-02 MED ORDER — HEPARIN SODIUM (PORCINE) 1000 UNIT/ML IJ SOLN
INTRAMUSCULAR | Status: AC
  Filled 2017-01-02: qty 1

## 2017-01-02 MED ORDER — SODIUM CHLORIDE 0.9% FLUSH: 3.0000 mL | Freq: Two times a day (BID) | INTRAVENOUS | Status: DC

## 2017-01-02 MED ORDER — LABETALOL HCL 5 MG/ML IV SOLN: 10.0000 mg | INTRAVENOUS | Status: AC | PRN

## 2017-01-02 MED ORDER — MIDAZOLAM HCL 2 MG/2ML IJ SOLN
INTRAMUSCULAR | Status: AC
  Filled 2017-01-02: qty 2

## 2017-01-02 MED ORDER — ESCITALOPRAM OXALATE 10 MG PO TABS
10.0000 mg | ORAL_TABLET | Freq: Every day | ORAL | Status: DC
  Administered 2017-01-03: 09:00:00 10 mg via ORAL
  Filled 2017-01-02: qty 1

## 2017-01-02 MED ORDER — POTASSIUM CHLORIDE CRYS ER 20 MEQ PO TBCR
40.0000 meq | EXTENDED_RELEASE_TABLET | Freq: Every day | ORAL | Status: DC
  Administered 2017-01-03: 40 meq via ORAL
  Filled 2017-01-02: qty 2

## 2017-01-02 MED ORDER — SODIUM CHLORIDE 0.9 % IV SOLN: 250.0000 mL | INTRAVENOUS | Status: DC | PRN

## 2017-01-02 MED ORDER — IRBESARTAN 75 MG PO TABS
150.0000 mg | ORAL_TABLET | Freq: Every day | ORAL | Status: DC
  Administered 2017-01-03: 150 mg via ORAL
  Filled 2017-01-02: qty 2

## 2017-01-02 MED ORDER — DIAZEPAM 5 MG PO TABS: 10.0000 mg | ORAL_TABLET | Freq: Three times a day (TID) | ORAL | Status: DC | PRN

## 2017-01-02 MED ORDER — HYDRALAZINE HCL 20 MG/ML IJ SOLN: 5.0000 mg | INTRAMUSCULAR | Status: AC | PRN

## 2017-01-02 MED ORDER — VERAPAMIL HCL 2.5 MG/ML IV SOLN
INTRAVENOUS | Status: DC | PRN
  Administered 2017-01-02: 10 mL via INTRA_ARTERIAL

## 2017-01-02 MED ORDER — AMLODIPINE BESYLATE-VALSARTAN 5-160 MG PO TABS: 0.5000 | ORAL_TABLET | Freq: Every day | ORAL | Status: DC

## 2017-01-02 MED ORDER — LEVOTHYROXINE SODIUM 50 MCG PO TABS: 50.0000 ug | ORAL_TABLET | Freq: Every day | ORAL | Status: DC

## 2017-01-02 MED ORDER — AMLODIPINE BESYLATE 5 MG PO TABS
5.0000 mg | ORAL_TABLET | Freq: Every day | ORAL | Status: DC
  Administered 2017-01-03: 09:00:00 5 mg via ORAL
  Filled 2017-01-02: qty 1

## 2017-01-02 MED ORDER — SODIUM CHLORIDE 0.9 % WEIGHT BASED INFUSION: 1.0000 mL/kg/h | INTRAVENOUS | Status: DC

## 2017-01-02 MED ORDER — SODIUM CHLORIDE 0.9 % WEIGHT BASED INFUSION: 3.0000 mL/kg/h | INTRAVENOUS | Status: DC

## 2017-01-02 MED ORDER — FENTANYL CITRATE (PF) 100 MCG/2ML IJ SOLN
INTRAMUSCULAR | Status: DC | PRN
  Administered 2017-01-02 (×2): 25 ug via INTRAVENOUS

## 2017-01-02 MED ORDER — CLOPIDOGREL BISULFATE 300 MG PO TABS
ORAL_TABLET | ORAL | Status: AC
  Filled 2017-01-02: qty 1

## 2017-01-02 MED ORDER — CLOPIDOGREL BISULFATE 300 MG PO TABS
ORAL_TABLET | ORAL | Status: DC | PRN
  Administered 2017-01-02: 600 mg via ORAL

## 2017-01-02 MED ORDER — ASPIRIN EC 81 MG PO TBEC
81.0000 mg | DELAYED_RELEASE_TABLET | Freq: Every day | ORAL | Status: DC
  Administered 2017-01-03: 09:00:00 81 mg via ORAL
  Filled 2017-01-02: qty 1

## 2017-01-02 MED ORDER — VERAPAMIL HCL 2.5 MG/ML IV SOLN
INTRAVENOUS | Status: AC
  Filled 2017-01-02: qty 2

## 2017-01-02 MED ORDER — HEPARIN (PORCINE) IN NACL 2-0.9 UNIT/ML-% IJ SOLN
INTRAMUSCULAR | Status: AC | PRN
  Administered 2017-01-02: 1500 mL

## 2017-01-02 MED ORDER — ACETAMINOPHEN 500 MG PO TABS: 1000.0000 mg | ORAL_TABLET | Freq: Four times a day (QID) | ORAL | Status: DC | PRN

## 2017-01-02 MED ORDER — FENTANYL CITRATE (PF) 100 MCG/2ML IJ SOLN
INTRAMUSCULAR | Status: AC
  Filled 2017-01-02: qty 2

## 2017-01-02 MED ORDER — SODIUM CHLORIDE 0.9 % WEIGHT BASED INFUSION
1.0000 mL/kg/h | INTRAVENOUS | Status: AC
  Administered 2017-01-02: 18:00:00 1 mL/kg/h via INTRAVENOUS

## 2017-01-02 MED ORDER — ASPIRIN 81 MG PO CHEW: 81.0000 mg | CHEWABLE_TABLET | ORAL | Status: DC

## 2017-01-02 MED ORDER — NITROGLYCERIN 1 MG/10 ML FOR IR/CATH LAB
INTRA_ARTERIAL | Status: DC | PRN
  Administered 2017-01-02: 200 ug via INTRACORONARY
  Administered 2017-01-02: 150 ug via INTRACORONARY

## 2017-01-02 MED ORDER — TRAMADOL HCL 50 MG PO TABS: 50.0000 mg | ORAL_TABLET | Freq: Four times a day (QID) | ORAL | Status: DC | PRN

## 2017-01-02 MED ORDER — CLOPIDOGREL BISULFATE 75 MG PO TABS
75.0000 mg | ORAL_TABLET | Freq: Every day | ORAL | Status: DC
  Administered 2017-01-03: 09:00:00 75 mg via ORAL
  Filled 2017-01-02: qty 1

## 2017-01-02 MED ORDER — MIDAZOLAM HCL 2 MG/2ML IJ SOLN
INTRAMUSCULAR | Status: DC | PRN
  Administered 2017-01-02 (×2): 2 mg via INTRAVENOUS

## 2017-01-02 MED ORDER — HEPARIN (PORCINE) IN NACL 2-0.9 UNIT/ML-% IJ SOLN
INTRAMUSCULAR | Status: AC
  Filled 2017-01-02: qty 1000

## 2017-01-02 MED ORDER — IOPAMIDOL (ISOVUE-370) INJECTION 76%
INTRAVENOUS | Status: AC
  Filled 2017-01-02: qty 100

## 2017-01-02 MED ORDER — LIDOCAINE HCL (PF) 1 % IJ SOLN
INTRAMUSCULAR | Status: DC | PRN
  Administered 2017-01-02: 2 mL via SUBCUTANEOUS

## 2017-01-02 MED ORDER — ADENOSINE 12 MG/4ML IV SOLN
INTRAVENOUS | Status: AC
  Filled 2017-01-02: qty 16

## 2017-01-02 MED ORDER — HEPARIN SODIUM (PORCINE) 1000 UNIT/ML IJ SOLN
INTRAMUSCULAR | Status: DC | PRN
  Administered 2017-01-02: 3500 [IU] via INTRAVENOUS
  Administered 2017-01-02 (×2): 4000 [IU] via INTRAVENOUS

## 2017-01-02 MED ORDER — IOPAMIDOL (ISOVUE-370) INJECTION 76%
INTRAVENOUS | Status: DC | PRN
  Administered 2017-01-02: 145 mL via INTRA_ARTERIAL

## 2017-01-02 SURGICAL SUPPLY — 20 items
BALLN EMERGE MR 2.5X15 (BALLOONS) ×2
BALLN ~~LOC~~ EMERGE MR 3.25X20 (BALLOONS) ×2
BALLOON EMERGE MR 2.5X15 (BALLOONS) IMPLANT
BALLOON ~~LOC~~ EMERGE MR 3.25X20 (BALLOONS) IMPLANT
CATH 5FR JL3.5 JR4 ANG PIG MP (CATHETERS) ×1 IMPLANT
CATH MICROCATH NAVVUS (MICROCATHETER) IMPLANT
CATH VISTA GUIDE 6FR XBLAD3.5 (CATHETERS) ×1 IMPLANT
DEVICE RAD COMP TR BAND LRG (VASCULAR PRODUCTS) ×1 IMPLANT
GLIDESHEATH SLEND SS 6F .021 (SHEATH) ×1 IMPLANT
GUIDEWIRE INQWIRE 1.5J.035X260 (WIRE) IMPLANT
INQWIRE 1.5J .035X260CM (WIRE) ×2
KIT ENCORE 26 ADVANTAGE (KITS) ×1 IMPLANT
KIT HEART LEFT (KITS) ×2 IMPLANT
MICROCATHETER NAVVUS (MICROCATHETER) ×2
PACK CARDIAC CATHETERIZATION (CUSTOM PROCEDURE TRAY) ×2 IMPLANT
STENT SIERRA 3.00 X 28 MM (Permanent Stent) ×1 IMPLANT
SYR MEDRAD MARK V 150ML (SYRINGE) ×2 IMPLANT
TRANSDUCER W/STOPCOCK (MISCELLANEOUS) ×2 IMPLANT
TUBING CIL FLEX 10 FLL-RA (TUBING) ×2 IMPLANT
WIRE COUGAR XT STRL 190CM (WIRE) ×1 IMPLANT

## 2017-01-02 NOTE — Interval H&P Note (Signed)
History and Physical Interval Note:  01/02/2017 3:40 PM  [name]  has presented today for surgery, with the diagnosis of angina  The various methods of treatment have been discussed with the patient and family. After consideration of risks, benefits and other options for treatment, the patient has consented to  Procedure(s): LEFT HEART CATH AND CORONARY ANGIOGRAPHY (N/A) as a surgical intervention .  The patient's history has been reviewed, patient examined, no change in status, stable for surgery.  I have reviewed the patient's chart and labs.  Questions were answered to the patient's satisfaction.     [name]

## 2017-01-03 ENCOUNTER — Other Ambulatory Visit: Payer: Self-pay

## 2017-01-03 DIAGNOSIS — E7800 Pure hypercholesterolemia, unspecified: Secondary | ICD-10-CM | POA: Diagnosis not present

## 2017-01-03 DIAGNOSIS — E119 Type 2 diabetes mellitus without complications: Secondary | ICD-10-CM | POA: Diagnosis not present

## 2017-01-03 DIAGNOSIS — Z7982 Long term (current) use of aspirin: Secondary | ICD-10-CM | POA: Diagnosis not present

## 2017-01-03 DIAGNOSIS — Z8249 Family history of ischemic heart disease and other diseases of the circulatory system: Secondary | ICD-10-CM | POA: Diagnosis not present

## 2017-01-03 DIAGNOSIS — Z87891 Personal history of nicotine dependence: Secondary | ICD-10-CM | POA: Diagnosis not present

## 2017-01-03 DIAGNOSIS — I25118 Atherosclerotic heart disease of native coronary artery with other forms of angina pectoris: Secondary | ICD-10-CM | POA: Diagnosis not present

## 2017-01-03 DIAGNOSIS — I10 Essential (primary) hypertension: Secondary | ICD-10-CM | POA: Diagnosis not present

## 2017-01-03 DIAGNOSIS — I2582 Chronic total occlusion of coronary artery: Secondary | ICD-10-CM | POA: Diagnosis not present

## 2017-01-03 DIAGNOSIS — I2584 Coronary atherosclerosis due to calcified coronary lesion: Secondary | ICD-10-CM | POA: Diagnosis not present

## 2017-01-03 LAB — BASIC METABOLIC PANEL
ANION GAP: 5 (ref 5–15)
BUN: 8 mg/dL (ref 6–20)
CALCIUM: 8.7 mg/dL — AB (ref 8.9–10.3)
CO2: 29 mmol/L (ref 22–32)
CREATININE: 1.14 mg/dL (ref 0.61–1.24)
Chloride: 106 mmol/L (ref 101–111)
Glucose, Bld: 81 mg/dL (ref 65–99)
Potassium: 3.6 mmol/L (ref 3.5–5.1)
SODIUM: 140 mmol/L (ref 135–145)

## 2017-01-03 LAB — CBC
HCT: 40.7 % (ref 39.0–52.0)
HEMOGLOBIN: 13.8 g/dL (ref 13.0–17.0)
MCH: 31.5 pg (ref 26.0–34.0)
MCHC: 33.9 g/dL (ref 30.0–36.0)
MCV: 92.9 fL (ref 78.0–100.0)
PLATELETS: 136 10*3/uL — AB (ref 150–400)
RBC: 4.38 MIL/uL (ref 4.22–5.81)
RDW: 14.7 % (ref 11.5–15.5)
WBC: 6 10*3/uL (ref 4.0–10.5)

## 2017-01-03 LAB — GLUCOSE, CAPILLARY: Glucose-Capillary: 88 mg/dL (ref 65–99)

## 2017-01-03 MED ORDER — CLOPIDOGREL BISULFATE 75 MG PO TABS: 75.0000 mg | ORAL_TABLET | Freq: Every day | ORAL | 3 refills | Status: DC

## 2017-01-03 MED ORDER — ATORVASTATIN CALCIUM 80 MG PO TABS: 80.0000 mg | ORAL_TABLET | Freq: Every day | ORAL | 6 refills | Status: DC

## 2017-01-03 MED ORDER — CARVEDILOL 3.125 MG PO TABS: 3.1250 mg | ORAL_TABLET | Freq: Two times a day (BID) | ORAL | 6 refills | Status: DC

## 2017-01-03 MED ORDER — ATORVASTATIN CALCIUM 80 MG PO TABS
80.0000 mg | ORAL_TABLET | Freq: Every day | ORAL | Status: DC
  Administered 2017-01-03: 09:00:00 80 mg via ORAL
  Filled 2017-01-03: qty 1

## 2017-01-03 MED ORDER — CARVEDILOL 3.125 MG PO TABS
3.1250 mg | ORAL_TABLET | Freq: Two times a day (BID) | ORAL | Status: DC
  Administered 2017-01-03: 3.125 mg via ORAL
  Filled 2017-01-03: qty 1

## 2017-01-03 MED ORDER — CLOPIDOGREL BISULFATE 75 MG PO TABS: 75.0000 mg | ORAL_TABLET | Freq: Every day | ORAL | 11 refills | Status: DC

## 2017-01-03 NOTE — Progress Notes (Addendum)
CARDIAC REHAB PHASE I   PRE:  Rate/Rhythm: 85 SR   BP:   Sitting: 158/105 R   Manual on L 140/80     SaO2: 96% RA  MODE:  Ambulation: 528 ft   POST:  Rate/Rhythm: 90 SR  BP:   Sitting: 166/88 manual on L     SaO2: 98% RA  [phone]  Pt ambulated 582ft independently, pt maintained a very steady gait and pace. Pt denied symptoms of CP, dizziness and SOB. Returned pt to bedside. Pt BP slightly elevated, but pt has not taken morning meds. Pt otherwise stabled. Education completed with patients and POA. Discussed the following: importance of antiplatelets therapy,stent card,  risk factors, lifting restrictions,HH diet, exercise guidelines, emergency action, and use of NTG. Pt and POA voiced understanding. Referral for cardiac rehab at [place] 01/03/2017 8:57 AM

## 2017-01-03 NOTE — Discharge Summary (Signed)
Discharge Summary    Patient ID: HJALMER IOVINO,  MRN: [id number], DOB/AGE: July 27, 1945 71 y.o.  Admit date: 01/02/2017 Discharge date: 01/03/2017  Primary Care Provider: Sharilyn Sites Primary Cardiologist: Dr. 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 Severe 2 vessel CAD with chronic total occlusion of the RCA and severe stenosis of the proximal and mid-LAD confirmed by pressure wire analysis 2. Successful PCI of the proximal/mid LAD lesions with a single DES (3.0x28 mm [place]) 3. Normal LV systolic function with LVEF estimated at 55-65%  Recommend: DAPT with ASA and plavix at least 6 months.   Diagnostic Diagram       Post-Intervention Diagram             History of Present Illness     71 y.o. male with hx of HTN, DM and recurrent kidney stone presented for outpatient cath.   Recently seen for chest pain concerning for angina. GXTon 10/16/15 demonstrated a hypertensive response to exercise. There were no diagnostic ST segment changes to indicate ischemia but Duke treadmill score was intermediate due to exercise duration. He had no chest pain during the stress test at that time.However, it appears he is having more frequent symptoms. He has a strong family history of heart disease. His brother recently died of an MI at age 18 and his father died of an MI at age 54. Due to symptoms and risk factors plan made for coronary angiography.  Hospital Course     Consultants: None  1. CAD - Cath showed  severe 2 vessel CAD with chronic total occlusion of the RCA with (RPDA filled by collaterals from 2nd Sept & Post Atrio filled by collaterals from 1st Sept )  and severe stenosis of the proximal and mid-LAD confirmed by pressure wire analysis s/p PTCA with a single DES (3.0x28 mm [place]). DAPT for at least 6 months. Continue BB and statin.   2. HTN - BP remained elevated. Continue home Amlodipine/Valsartan 5/160mg .  Added coreg 3.125mg  BID. Up-titrate as needed as outpatient.   3. HLD - Patient states that recent labs by PCP showed high cholesterol (will bring labs during follow up). Start Lipitor 80mg  qd. Consider OP f/u labs 6-8 weeks given statin initiation this admission.  The patient has been seen by Dr. Harl Bowie today and deemed ready for discharge home. All follow-up appointments have been scheduled. Discharge medications are listed below.    Discharge Vitals Blood pressure (!) 158/93, pulse 85, temperature 97.9 F (36.6 C), temperature source Oral, resp. rate 14, height 5\' 8"  (1.727 m), weight 164 lb 3.9 oz (74.5 kg), SpO2 99 %.  Filed Weights   01/02/17 0954 01/03/17 0652  Weight: 162 lb (73.5 kg) 164 lb 3.9 oz (74.5 kg)    Labs & Radiologic Studies     CBC Recent Labs    01/03/17 0313  WBC 6.0  HGB 13.8  HCT 40.7  MCV 92.9  PLT 409*   Basic Metabolic Panel Recent Labs    01/03/17 0313  NA 140  K 3.6  CL 106  CO2 29  GLUCOSE 81  BUN 8  CREATININE 1.14  CALCIUM 8.7*    Dg Chest  2 View  Result Date: 12/24/2016 CLINICAL DATA:  Chest pain, preoperative evaluation EXAM: CHEST  2 VIEW COMPARISON:  09/15/2015 FINDINGS: Cardiac shadow is within normal limits. Curvilinear density is noted over the left heart border which corresponds to a a focal area of herniated abdominal fat as seen on prior CT examination. No focal infiltrate or sizable effusion seen. No bony abnormality is noted. IMPRESSION: No active cardiopulmonary disease. Electronically Signed   By: Inez Catalina M.D.   On: 12/24/2016 13:11    Disposition   Pt is being discharged home today in good condition.  Follow-up Plans & Appointments    Follow-up  Information    Herminio Commons, MD. Go on 01/26/2017.   Specialty:  Cardiology Why:  @1pm  for post hospital follow up Contact information: [address] [place] [address] [phone]          Discharge Instructions    Amb Referral to Cardiac Rehabilitation   Complete by:  As directed    Diagnosis:  Coronary Stents   Diet - low sodium heart healthy   Complete by:  As directed    Discharge instructions   Complete by:  As directed    No driving for 48 hours. No lifting over 5 lbs for 1 week. No sexual activity for 1 week. You may return to work on 01/07/17. Keep procedure site clean & dry. If you notice increased pain, swelling, bleeding or pus, call/return!  You may shower, but no soaking baths/hot tubs/pools for 1 week.   Increase activity slowly   Complete by:  As directed       Discharge Medications   Current Discharge Medication List    START taking these medications   Details  atorvastatin (LIPITOR) 80 MG tablet Take 1 tablet (80 mg total) daily at 6 PM by mouth. Qty: 30 tablet, Refills: 6    carvedilol (COREG) 3.125 MG tablet Take 1 tablet (3.125 mg total) 2 (two) times daily with a meal by mouth. Qty: 60 tablet, Refills: 6    clopidogrel (PLAVIX) 75 MG tablet Take 1 tablet (75 mg total) daily with breakfast by mouth. Qty: 90 tablet, Refills: 3      CONTINUE these medications which have NOT CHANGED   Details  amLODipine-valsartan (EXFORGE) 5-160 MG per tablet Take 0.5 tablets daily by mouth.     aspirin EC 81 MG tablet Take 81 mg by mouth daily.    diazepam (VALIUM) 10 MG tablet Take 10 mg by mouth 3 (three) times daily as needed for anxiety.    escitalopram (LEXAPRO) 10 MG tablet Take 10 mg daily by mouth.     levothyroxine (SYNTHROID, LEVOTHROID) 50 MCG tablet Take 50 mcg daily before breakfast by mouth.    Naphazoline HCl (CLEAR EYES OP) Place 2 drops as needed into both eyes (for dry eyes).    nitroGLYCERIN (NITROSTAT) 0.4 MG SL tablet Place 1  tablet (0.4 mg total) under the tongue every 5 (five) minutes as needed. Qty: 25 tablet, Refills: 3    potassium chloride SA (K-DUR,KLOR-CON) 20 MEQ tablet Take 2 tablets (40 mEq total) by mouth daily. Qty: 180 tablet, Refills: 3    traMADol (ULTRAM) 50 MG tablet Take 50 mg every 6 (six) hours as needed by mouth for moderate pain.     acetaminophen (TYLENOL) 500 MG tablet Take 1,000 mg every 6 (six) hours as needed by mouth for moderate pain or headache.      STOP taking these medications  ibuprofen (ADVIL,MOTRIN) 800 MG tablet          Outstanding Labs/Studies   Consider OP f/u labs 6-8 weeks given statin initiation this admission.  Duration of Discharge Encounter   Greater than 30 minutes including physician time.  Signed, Ayriana Wix PA-C 01/03/2017, 9:16 AM

## 2017-01-03 NOTE — Progress Notes (Signed)
Progress Note  Patient Name: [name] Date of Encounter: 01/03/2017  Primary Cardiologist: Dr. Bronson Ing  Subjective   Feeling well. No chest pain, sob or palpitations.   Inpatient Medications    Scheduled Meds: . amLODipine  5 mg Oral Daily  . aspirin EC  81 mg Oral Daily  . clopidogrel  75 mg Oral Q breakfast  . escitalopram  10 mg Oral Daily  . irbesartan  150 mg Oral Daily  . levothyroxine  50 mcg Oral QAC breakfast  . potassium chloride SA  40 mEq Oral Daily  . sodium chloride flush  3 mL Intravenous Q12H   Continuous Infusions: . sodium chloride     PRN Meds: sodium chloride, acetaminophen, diazepam, ondansetron (ZOFRAN) IV, sodium chloride flush, traMADol   Vital Signs    Vitals:   01/02/17 2130 01/02/17 2200 01/02/17 2230 01/03/17 0652  BP: 140/77 (!) 148/74 (!) 148/82   Pulse: 66 65 66   Resp: 18 14 17    Temp:      TempSrc:      SpO2: 92% 94% 98%   Weight:    164 lb 3.9 oz (74.5 kg)  Height:        Intake/Output Summary (Last 24 hours) at 01/03/2017 0701 Last data filed at 01/03/2017 0500 Gross per 24 hour  Intake 1524.23 ml  Output 1371 ml  Net 153.23 ml   Filed Weights   01/02/17 0954 01/03/17 0652  Weight: 162 lb (73.5 kg) 164 lb 3.9 oz (74.5 kg)    Telemetry    SR - Personally Reviewed  ECG    Sinus rhythm  - Personally Reviewed  Physical Exam   GEN: No acute distress.   Neck: No JVD Cardiac: RRR, no murmurs, rubs, or gallops. No radial cath site hematoma on right side.  Respiratory: Clear to auscultation bilaterally. GI: Soft, nontender, non-distended  MS: No edema; No deformity. Neuro:  Nonfocal  Psych: Normal affect   Labs    Chemistry Recent Labs  Lab 12/31/16 0832 01/03/17 0313  NA 137 140  K 3.7 3.6  CL 101 106  CO2 27 29  GLUCOSE 94 81  BUN 12 8  CREATININE 1.14 1.14  CALCIUM 9.1 8.7*  GFRNONAA >60 >60  GFRAA >60 >60  ANIONGAP 9 5     Hematology Recent Labs  Lab 01/03/17 0313  WBC 6.0    RBC 4.38  HGB 13.8  HCT 40.7  MCV 92.9  MCH 31.5  MCHC 33.9  RDW 14.7  PLT 136*      Radiology    No results found.  Cardiac Studies   CORONARY STENT INTERVENTION  INTRAVASCULAR PRESSURE WIRE/FFR STUDY  LEFT HEART CATH AND CORONARY ANGIOGRAPHY  Conclusion   1. Severe 2 vessel CAD with chronic total occlusion of the RCA and severe stenosis of the proximal and mid-LAD confirmed by pressure wire analysis 2. Successful PCI of the proximal/mid LAD lesions with a single DES (3.0x28 mm [place]) 3. Normal LV systolic function with LVEF estimated at 55-65%  Recommend: DAPT with ASA and plavix at least 6 months.   Diagnostic Diagram       Post-Intervention Diagram          Patient Profile     71 y.o. male with hx of HTN, DM and recurrent kidney stone presented for outpatient cath.   Recently seen for chest pain concerning for angina. GXT on 10/16/15 demonstrated a hypertensive response to exercise.  There were no diagnostic ST segment  changes to indicate ischemia but Duke treadmill score was intermediate due to exercise duration.  He had no chest pain during the stress test at that time.However, it appears he is having more frequent symptoms.  He has a strong family history of heart disease.  His brother recently died of an MI at age 84 and his father died of an MI at age 80. Due to symptoms and risk factors plan made for coronary angiography.  Assessment & Plan    1. CAD - Cath showed  severe 2 vessel CAD with chronic total occlusion of the RCA with (RPDA filled by collaterals from 2nd Sept & Post Atrio filled by collaterals from 1st Sept ) and severe stenosis of the proximal and mid-LAD confirmed by pressure wire analysis s/p PTCA with a single DES (3.0x28 mm [place]). - DAPT for at least 6 months.   2. HTN - BP remained elevated. Continue home Amlodipine/Valsartan 5/160mg . Will add coreg 3.125mg  BID.  3. HLD - Patient states that recent labs by PCP showed high  cholesterol (will bring labs during follow up). Start Lipitor 80mg  qd. Consider OP f/u labs 6-8 weeks given statin initiation this admission.   For questions or updates, please contact [facility] Please consult [email or web address] for contact info under Cardiology/STEMI.      Signed, Leanor Kail, PA  01/03/2017, 7:01 AM    Patient seen and discussed with PA Bhavinkumar. Patient is s/p cath yesterday, received DES to LAD. RCA CTO managed medically. Doing well this morning, postcath labs are stable. Medical therapy for CAD with ASA, statin, beta blocker, plavix, avapro. Plan for discharge today.   Carlyle Dolly MD

## 2017-01-05 ENCOUNTER — Encounter (HOSPITAL_COMMUNITY): Payer: Self-pay | Admitting: Cardiovascular Disease

## 2017-01-26 ENCOUNTER — Ambulatory Visit (INDEPENDENT_AMBULATORY_CARE_PROVIDER_SITE_OTHER): Payer: Medicare Other | Admitting: Cardiovascular Disease

## 2017-01-26 ENCOUNTER — Encounter: Payer: Self-pay | Admitting: Cardiovascular Disease

## 2017-01-26 VITALS — BP 136/86 | HR 76 | Ht 64.0 in | Wt 185.0 lb

## 2017-01-26 DIAGNOSIS — I25118 Atherosclerotic heart disease of native coronary artery with other forms of angina pectoris: Secondary | ICD-10-CM

## 2017-01-26 DIAGNOSIS — Z955 Presence of coronary angioplasty implant and graft: Secondary | ICD-10-CM | POA: Diagnosis not present

## 2017-01-26 DIAGNOSIS — I10 Essential (primary) hypertension: Secondary | ICD-10-CM | POA: Diagnosis not present

## 2017-01-26 DIAGNOSIS — E785 Hyperlipidemia, unspecified: Secondary | ICD-10-CM

## 2017-01-26 NOTE — Progress Notes (Signed)
SUBJECTIVE: The patient presents for follow-up after undergoing coronary angiography.  He was found to have severe two-vessel coronary artery disease with chronic total occlusion of the RCA and severe stenosis of the proximal and mid LAD.  He underwent successful PCI of the LAD lesions with a single drug-eluting stent.  He is feeling much better and denies chest pain, palpitations, leg swelling, and shortness of breath.  He denies bleeding problems.   Review of Systems: As per "subjective", otherwise negative.  No Known Allergies  Current Outpatient Medications  Medication Sig Dispense Refill  . acetaminophen (TYLENOL) 500 MG tablet Take 1,000 mg every 6 (six) hours as needed by mouth for moderate pain or headache.    Marland Kitchen amLODipine-valsartan (EXFORGE) 5-160 MG per tablet Take 0.5 tablets daily by mouth.     Marland Kitchen aspirin EC 81 MG tablet Take 81 mg by mouth daily.    Marland Kitchen atorvastatin (LIPITOR) 80 MG tablet Take 1 tablet (80 mg total) daily at 6 PM by mouth. 30 tablet 6  . carvedilol (COREG) 3.125 MG tablet Take 1 tablet (3.125 mg total) 2 (two) times daily with a meal by mouth. 60 tablet 6  . clopidogrel (PLAVIX) 75 MG tablet Take 1 tablet (75 mg total) daily with breakfast by mouth. 90 tablet 3  . diazepam (VALIUM) 10 MG tablet Take 10 mg by mouth 3 (three) times daily as needed for anxiety.    Marland Kitchen escitalopram (LEXAPRO) 10 MG tablet Take 10 mg daily by mouth.     . levothyroxine (SYNTHROID, LEVOTHROID) 50 MCG tablet Take 50 mcg daily before breakfast by mouth.    . Naphazoline HCl (CLEAR EYES OP) Place 2 drops as needed into both eyes (for dry eyes).    . nitroGLYCERIN (NITROSTAT) 0.4 MG SL tablet Place 1 tablet (0.4 mg total) under the tongue every 5 (five) minutes as needed. (Patient taking differently: Place 0.4 mg every 5 (five) minutes as needed under the tongue for chest pain. ) 25 tablet 3  . potassium chloride SA (K-DUR,KLOR-CON) 20 MEQ tablet Take 2 tablets (40 mEq total) by mouth  daily. 180 tablet 3  . traMADol (ULTRAM) 50 MG tablet Take 50 mg every 6 (six) hours as needed by mouth for moderate pain.      No current facility-administered medications for this visit.     Past Medical History:  Diagnosis Date  . Diabetes mellitus   . HOH (hard of hearing)   . Hypertension   . Kidney stone   . Recurrent kidney stones     Past Surgical History:  Procedure Laterality Date  . COLONOSCOPY  07/15/2011   Procedure: COLONOSCOPY;  Surgeon: Jamesetta So, MD;  Location: [facility];  Service: Gastroenterology;  Laterality: N/A;  . CORONARY STENT INTERVENTION N/A 01/02/2017   Procedure: CORONARY STENT INTERVENTION;  Surgeon: [name], MD;  Location: [facility];  Service: Cardiovascular;  Laterality: N/A;  . CYSTOSCOPY W/ RETROGRADES Right 01/25/2013   Procedure: CYSTOSCOPY WITH RIGHT RETROGRADE PYELOGRAM, BALLOON DILATION RIGHT URETER AND RIGHT URETERAL STENT PLACEMENT;  Surgeon: Marissa Nestle, MD;  Location: [facility];  Service: Urology;  Laterality: Right;  . CYSTOSCOPY W/ URETERAL STENT PLACEMENT Left 12/28/2012   Procedure: CYSTOSCOPY WITH RETROGRADE PYELOGRAM/LEFT URETERAL STENT PLACEMENT;  Surgeon: Marissa Nestle, MD;  Location: [facility];  Service: Urology;  Laterality: Left;  . CYSTOSCOPY W/ URETERAL STENT REMOVAL Left 01/25/2013   Procedure: CYSTOSCOPY WITH LEFT URETERALSTENT REMOVAL;  Surgeon: Silvano Rusk  Michela Pitcher, MD;  Location: [facility];  Service: Urology;  Laterality: Left;  . EXTRACORPOREAL SHOCK WAVE LITHOTRIPSY Right 01/05/2013   Procedure: EXTRACORPOREAL SHOCK WAVE LITHOTRIPSY (ESWL) RIGHT URETERAL CALCULUS;  Surgeon: Marissa Nestle, MD;  Location: [facility];  Service: Urology;  Laterality: Right;  . EXTRACORPOREAL SHOCK WAVE LITHOTRIPSY Right 02/02/2013   Procedure: EXTRACORPOREAL SHOCK WAVE LITHOTRIPSY (ESWL) RIGHT URETERAL CALCULUS;  Surgeon: Marissa Nestle, MD;  Location: [facility];  Service: Urology;  Laterality: Right;  . EXTRACORPOREAL SHOCK  WAVE LITHOTRIPSY Right 05/25/2013   Procedure: EXTRACORPOREAL SHOCK WAVE LITHOTRIPSY (ESWL) RIGHT URETERAL CALCULUS;  Surgeon: Marissa Nestle, MD;  Location: [facility];  Service: Urology;  Laterality: Right;  . HOLMIUM LASER APPLICATION Left 12/02/3233   Procedure: HOLMIUM LASER APPLICATION;  Surgeon: Marissa Nestle, MD;  Location: [facility];  Service: Urology;  Laterality: Left;  . INTRAVASCULAR PRESSURE WIRE/FFR STUDY N/A 01/02/2017   Procedure: INTRAVASCULAR PRESSURE WIRE/FFR STUDY;  Surgeon: [name], MD;  Location: [facility];  Service: Cardiovascular;  Laterality: N/A;  . LEFT HEART CATH AND CORONARY ANGIOGRAPHY N/A 01/02/2017   Procedure: LEFT HEART CATH AND CORONARY ANGIOGRAPHY;  Surgeon: [name], MD;  Location: [place] [facility];  Service: Cardiovascular;  Laterality: N/A;  . LITHOTRIPSY     X 2  . LITHOTRIPSY    . STONE EXTRACTION WITH BASKET Left 12/28/2012   Procedure: STONE EXTRACTION WITH BASKET;  Surgeon: Marissa Nestle, MD;  Location: [facility];  Service: Urology;  Laterality: Left;    Social History   Socioeconomic History  . Marital status: Single    Spouse name: Not on file  . Number of children: Not on file  . Years of education: Not on file  . Highest education level: Not on file  Social Needs  . Financial resource strain: Not on file  . Food insecurity - worry: Not on file  . Food insecurity - inability: Not on file  . Transportation needs - medical: Not on file  . Transportation needs - non-medical: Not on file  Occupational History  . Not on file  Tobacco Use  . Smoking status: Former Smoker    Types: Cigarettes    Last attempt to quit: 02/25/1980    Years since quitting: 36.9  . Smokeless tobacco: Former Systems developer    Types: Snuff, Chew  Substance and Sexual Activity  . Alcohol use: No  . Drug use: No  . Sexual activity: Not Currently  Other Topics Concern  . Not on file  Social History Narrative   ** Merged History Encounter **          Vitals:   01/26/17 1255  BP: 136/86  Pulse: 76  SpO2: 96%  Weight: 185 lb (83.9 kg)  Height: 5\' 4"  (1.626 m)    Wt Readings from Last 3 Encounters:  01/26/17 185 lb (83.9 kg)  01/03/17 164 lb 3.9 oz (74.5 kg)  12/24/16 185 lb (83.9 kg)     PHYSICAL EXAM General: NAD HEENT: Normal. Neck: No JVD, no thyromegaly. Lungs: Clear to auscultation bilaterally with normal respiratory effort. CV: Regular rate and rhythm, normal S1/S2, no T7/D2, soft 1/6 systolicmurmur over RUSB. No pretibial or periankle edema.  No carotid bruit.   Abdomen: Soft, nontender, no distention.  Neurologic: Alert and oriented.  Psych: Normal affect. Skin: Normal. Musculoskeletal: No gross deformities.    ECG: Most recent ECG reviewed.   Labs: Lab Results  Component Value Date/Time   K 3.6 01/03/2017 03:13 AM  K 3.7 11/17/2013 05:52 PM   BUN 8 01/03/2017 03:13 AM   BUN 11 11/17/2013 05:52 PM   CREATININE 1.14 01/03/2017 03:13 AM   CREATININE 1.37 (H) 11/17/2013 05:52 PM   ALT 8 (L) 09/15/2015 12:18 PM   ALT 13 (L) 11/17/2013 05:52 PM   TSH 3.86 11/17/2013 05:52 PM   HGB 13.8 01/03/2017 03:13 AM   HGB 13.1 11/17/2013 05:52 PM     Lipids: No results found for: LDLCALC, LDLDIRECT, CHOL, TRIG, HDL     ASSESSMENT AND PLAN: 1.  Coronary artery disease status post PCI of the LAD with chronic total occlusion of the RCA: Symptomatically stable.  Continue dual antiplatelet therapy for minimum of 6 months.  Continue high intensity statin therapy with Lipitor and continue carvedilol.  I will make a referral to cardiac rehabilitation.  2.  Chronic hypertension: Blood pressure is mildly elevated.  He had been on a higher dose of antihypertensive therapy but it had to be reduced due to symptomatic hypotension.  I will continue to monitor blood pressures.  3.  Hypercholesterolemia: I will obtain a copy of lipids from PCP.  Disposition: Follow up 3 months   Kate Sable, M.D., F.A.C.C.

## 2017-01-26 NOTE — Patient Instructions (Signed)
Medication Instructions:  Your physician recommends that you continue on your current medications as directed. Please refer to the Current Medication list given to you today.   Labwork: NONE   Testing/Procedures: NONE   Follow-Up: Your physician recommends that you schedule a follow-up appointment in: 3 Months with Dr. Bronson Ing.   Any Other Special Instructions Will Be Listed Below (If Applicable).  I will request your labs from Dr. Hilma Favors    If you need a refill on your cardiac medications before your next appointment, please call your pharmacy.  Thank you for choosing [facility]!

## 2017-04-05 ENCOUNTER — Other Ambulatory Visit: Payer: Self-pay

## 2017-04-05 ENCOUNTER — Emergency Department (HOSPITAL_COMMUNITY)
Admission: EM | Admit: 2017-04-05 | Discharge: 2017-04-05 | Disposition: A | Discharge status: Home / Self Care | Payer: Medicare Other | Attending: Emergency Medicine | Admitting: Emergency Medicine

## 2017-04-05 ENCOUNTER — Encounter (HOSPITAL_COMMUNITY): Payer: Self-pay | Admitting: Emergency Medicine

## 2017-04-05 ENCOUNTER — Emergency Department (HOSPITAL_COMMUNITY): Payer: Medicare Other

## 2017-04-05 DIAGNOSIS — I25118 Atherosclerotic heart disease of native coronary artery with other forms of angina pectoris: Secondary | ICD-10-CM | POA: Insufficient documentation

## 2017-04-05 DIAGNOSIS — R262 Difficulty in walking, not elsewhere classified: Secondary | ICD-10-CM | POA: Diagnosis not present

## 2017-04-05 DIAGNOSIS — Z79899 Other long term (current) drug therapy: Secondary | ICD-10-CM | POA: Diagnosis not present

## 2017-04-05 DIAGNOSIS — R269 Unspecified abnormalities of gait and mobility: Secondary | ICD-10-CM | POA: Insufficient documentation

## 2017-04-05 DIAGNOSIS — I119 Hypertensive heart disease without heart failure: Secondary | ICD-10-CM | POA: Insufficient documentation

## 2017-04-05 DIAGNOSIS — E119 Type 2 diabetes mellitus without complications: Secondary | ICD-10-CM | POA: Diagnosis not present

## 2017-04-05 DIAGNOSIS — R42 Dizziness and giddiness: Secondary | ICD-10-CM | POA: Insufficient documentation

## 2017-04-05 DIAGNOSIS — Z87891 Personal history of nicotine dependence: Secondary | ICD-10-CM | POA: Diagnosis not present

## 2017-04-05 DIAGNOSIS — Z7902 Long term (current) use of antithrombotics/antiplatelets: Secondary | ICD-10-CM | POA: Diagnosis not present

## 2017-04-05 DIAGNOSIS — R410 Disorientation, unspecified: Secondary | ICD-10-CM | POA: Diagnosis not present

## 2017-04-05 LAB — COMPREHENSIVE METABOLIC PANEL
ALT: 15 U/L — AB (ref 17–63)
AST: 23 U/L (ref 15–41)
Albumin: 3.8 g/dL (ref 3.5–5.0)
Alkaline Phosphatase: 76 U/L (ref 38–126)
Anion gap: 8 (ref 5–15)
BUN: 13 mg/dL (ref 6–20)
CHLORIDE: 106 mmol/L (ref 101–111)
CO2: 27 mmol/L (ref 22–32)
CREATININE: 0.99 mg/dL (ref 0.61–1.24)
Calcium: 9.3 mg/dL (ref 8.9–10.3)
GFR calc non Af Amer: >60 mL/min (ref >60)
Glucose, Bld: 94 mg/dL (ref 65–99)
POTASSIUM: 4.2 mmol/L (ref 3.5–5.1)
SODIUM: 141 mmol/L (ref 135–145)
Total Bilirubin: 1.6 mg/dL — ABNORMAL HIGH (ref 0.3–1.2)
Total Protein: 6.8 g/dL (ref 6.5–8.1)

## 2017-04-05 LAB — DIFFERENTIAL
BASOS ABS: 0 10*3/uL (ref 0.0–0.1)
BASOS PCT: 0 %
EOS ABS: 0.1 10*3/uL (ref 0.0–0.7)
Eosinophils Relative: 1 %
Lymphocytes Relative: 16 %
Lymphs Abs: 1 10*3/uL (ref 0.7–4.0)
Monocytes Absolute: 0.4 10*3/uL (ref 0.1–1.0)
Monocytes Relative: 7 %
NEUTROS ABS: 4.6 10*3/uL (ref 1.7–7.7)
Neutrophils Relative %: 76 %

## 2017-04-05 LAB — CBC
HCT: 44 % (ref 39.0–52.0)
Hemoglobin: 14 g/dL (ref 13.0–17.0)
MCH: 30.6 pg (ref 26.0–34.0)
MCHC: 31.8 g/dL (ref 30.0–36.0)
MCV: 96.1 fL (ref 78.0–100.0)
PLATELETS: 178 10*3/uL (ref 150–400)
RBC: 4.58 MIL/uL (ref 4.22–5.81)
RDW: 14.9 % (ref 11.5–15.5)
WBC: 6.1 10*3/uL (ref 4.0–10.5)

## 2017-04-05 LAB — PROTIME-INR
INR: 1
PROTHROMBIN TIME: 13.1 s (ref 11.4–15.2)

## 2017-04-05 LAB — APTT: APTT: 28 s (ref 24–36)

## 2017-04-05 LAB — TROPONIN I: Troponin I: <0.03 ng/mL (ref <0.03)

## 2017-04-05 NOTE — ED Triage Notes (Signed)
Per family patient went to visit a friend and patient's gait notable gait being unsteady. Patient states feels "weak all over." [name] stated that he had some facial drooping and confusion. Patient alert and oriented. Family denies any facial drooping. Patient last well known Friday. Per patient unsteady on his feet yesterday and fell.

## 2017-04-05 NOTE — ED Provider Notes (Signed)
[facility] EMERGENCY DEPARTMENT Provider Note   CSN: [id number] Arrival date & time: 04/05/17  1016     History   Chief Complaint Chief Complaint  Patient presents with  . Gait Problem    HPI [name] is a 72 y.o. male.  HPI  Patient presents with concern of gait difficulty. Onset was yesterday, about 36 hours ago, he noticed when he woke up. He was well prior to going to sleep the preceding night. Now, since onset patient has had some gait difficulty, with a "swimmy headed"sensation. He notes that it is actually improved over the past few hours, but given the persistence he went to his neighbor's for assistance, and was brought here for evaluation. He denies any recent medication changes, diet changes, activity changes patient lives alone, walks daily for activity. He does have a history of coronary disease, but no history of stroke. He takes Plavix and aspirin daily. Currently denies any ongoing pain, dyspnea, confusion, dissertation, vision changes or asymmetric weakness. He is here with his power of attorney who assists with the HPI.  Past Medical History:  Diagnosis Date  . Diabetes mellitus   . HOH (hard of hearing)   . Hypertension   . Kidney stone   . Recurrent kidney stones     Patient Active Problem List   Diagnosis Date Noted  . Coronary artery disease with exertional angina ([place]) 01/02/2017    Past Surgical History:  Procedure Laterality Date  . COLONOSCOPY  07/15/2011   Procedure: COLONOSCOPY;  Surgeon: Jamesetta So, MD;  [facility];  Service: Gastroenterology;  Laterality: N/A;  . CORONARY STENT INTERVENTION N/A 01/02/2017   Procedure: CORONARY STENT INTERVENTION;  Surgeon: [name], MD;  Location: [place] [facility];  Service: Cardiovascular;  Laterality: N/A;  . CYSTOSCOPY W/ RETROGRADES Right 01/25/2013   Procedure: CYSTOSCOPY WITH RIGHT RETROGRADE PYELOGRAM, BALLOON DILATION RIGHT URETER AND RIGHT URETERAL STENT  PLACEMENT;  Surgeon: Marissa Nestle, MD;  Location: [facility];  Service: Urology;  Laterality: Right;  . CYSTOSCOPY W/ URETERAL STENT PLACEMENT Left 12/28/2012   Procedure: CYSTOSCOPY WITH RETROGRADE PYELOGRAM/LEFT URETERAL STENT PLACEMENT;  Surgeon: Marissa Nestle, MD;  Location: [facility];  Service: Urology;  Laterality: Left;  . CYSTOSCOPY W/ URETERAL STENT REMOVAL Left 01/25/2013   Procedure: CYSTOSCOPY WITH LEFT URETERALSTENT REMOVAL;  Surgeon: Marissa Nestle, MD;  Location: [facility];  Service: Urology;  Laterality: Left;  . EXTRACORPOREAL SHOCK WAVE LITHOTRIPSY Right 01/05/2013   Procedure: EXTRACORPOREAL SHOCK WAVE LITHOTRIPSY (ESWL) RIGHT URETERAL CALCULUS;  Surgeon: Marissa Nestle, MD;  Location: [facility];  Service: Urology;  Laterality: Right;  . EXTRACORPOREAL SHOCK WAVE LITHOTRIPSY Right 02/02/2013   Procedure: EXTRACORPOREAL SHOCK WAVE LITHOTRIPSY (ESWL) RIGHT URETERAL CALCULUS;  Surgeon: Marissa Nestle, MD;  Location: [facility];  Service: Urology;  Laterality: Right;  . EXTRACORPOREAL SHOCK WAVE LITHOTRIPSY Right 05/25/2013   Procedure: EXTRACORPOREAL SHOCK WAVE LITHOTRIPSY (ESWL) RIGHT URETERAL CALCULUS;  Surgeon: Marissa Nestle, MD;  Location: [facility];  Service: Urology;  Laterality: Right;  . HOLMIUM LASER APPLICATION Left 89/03/1192   Procedure: HOLMIUM LASER APPLICATION;  Surgeon: Marissa Nestle, MD;  Location: [facility];  Service: Urology;  Laterality: Left;  . INTRAVASCULAR PRESSURE WIRE/FFR STUDY N/A 01/02/2017   Procedure: INTRAVASCULAR PRESSURE WIRE/FFR STUDY;  Surgeon: [name], MD;  Location: [place] [facility];  Service: Cardiovascular;  Laterality: N/A;  . LEFT HEART CATH AND CORONARY ANGIOGRAPHY N/A 01/02/2017   Procedure: LEFT HEART CATH AND  CORONARY ANGIOGRAPHY;  Surgeon: [name], MD;  Location: [place] [facility];  Service: Cardiovascular;  Laterality: N/A;  . LITHOTRIPSY     X 2  . LITHOTRIPSY    . STONE EXTRACTION WITH BASKET Left 12/28/2012    Procedure: STONE EXTRACTION WITH BASKET;  Surgeon: Marissa Nestle, MD;  Location: [facility];  Service: Urology;  Laterality: Left;       Home Medications    Prior to Admission medications   Medication Sig Start Date End Date Taking? Authorizing Provider  acetaminophen (TYLENOL) 500 MG tablet Take 1,000 mg every 6 (six) hours as needed by mouth for moderate pain or headache.    [provider]  amLODipine-valsartan (EXFORGE) 5-160 MG per tablet Take 0.5 tablets daily by mouth.     [provider]  aspirin EC 81 MG tablet Take 81 mg by mouth daily.    [provider]  atorvastatin (LIPITOR) 80 MG tablet Take 1 tablet (80 mg total) daily at 6 PM by mouth. 01/03/17   Bhagat, Bhavinkumar, PA  carvedilol (COREG) 3.125 MG tablet Take 1 tablet (3.125 mg total) 2 (two) times daily with a meal by mouth. 01/03/17   Bhagat, Bhavinkumar, PA  clopidogrel (PLAVIX) 75 MG tablet Take 1 tablet (75 mg total) daily with breakfast by mouth. 01/03/17   Bhagat, Bhavinkumar, PA  diazepam (VALIUM) 10 MG tablet Take 10 mg by mouth 3 (three) times daily as needed for anxiety.    [provider]  escitalopram (LEXAPRO) 10 MG tablet Take 10 mg daily by mouth.     [provider]  levothyroxine (SYNTHROID, LEVOTHROID) 50 MCG tablet Take 50 mcg daily before breakfast by mouth.    [provider]  Naphazoline HCl (CLEAR EYES OP) Place 2 drops as needed into both eyes (for dry eyes).    [provider]  nitroGLYCERIN (NITROSTAT) 0.4 MG SL tablet Place 1 tablet (0.4 mg total) under the tongue every 5 (five) minutes as needed. Patient taking differently: Place 0.4 mg every 5 (five) minutes as needed under the tongue for chest pain.  12/04/15   Herminio Commons, MD  potassium chloride SA (K-DUR,KLOR-CON) 20 MEQ tablet Take 2 tablets (40 mEq total) by mouth daily. 12/24/16   Herminio Commons, MD  traMADol (ULTRAM) 50 MG tablet Take 50 mg every 6 (six) hours as  needed by mouth for moderate pain.     [provider]    Family History Family History  Problem Relation Age of Onset  . Stroke Mother   . Heart attack Father   . Colon cancer Paternal Uncle     Social History Social History   Tobacco Use  . Smoking status: Former Smoker    Types: Cigarettes    Last attempt to quit: 02/25/1980    Years since quitting: 37.1  . Smokeless tobacco: Former Systems developer    Types: Snuff, Chew  Substance Use Topics  . Alcohol use: No  . Drug use: No     Allergies   Patient has no known allergies.   Review of Systems Review of Systems  Constitutional:       Per HPI, otherwise negative  HENT:       Per HPI, otherwise negative  Respiratory:       Per HPI, otherwise negative  Cardiovascular:       Per HPI, otherwise negative  Gastrointestinal: Negative for vomiting.  Endocrine:       Negative aside from HPI  Genitourinary:  Neg aside from HPI   Musculoskeletal:       Per HPI, otherwise negative  Skin: Negative.   Neurological: Positive for dizziness. Negative for syncope.     Physical Exam Updated Vital Signs BP (!) 162/85 (BP Location: Right Arm)   Pulse 62   Temp 97.6 F (36.4 C) (Oral)   Resp 15   Ht 5\' 8"  (1.727 m)   Wt 83.9 kg (185 lb)   SpO2 98%   BMI 28.13 kg/m   Physical Exam  Constitutional: He is oriented to person, place, and time. He appears well-developed. No distress.  HENT:  Head: Normocephalic and atraumatic.  Eyes: Conjunctivae and EOM are normal.  Cardiovascular: Normal rate and regular rhythm.  Pulmonary/Chest: Effort normal. No stridor. No respiratory distress.  Abdominal: He exhibits no distension.  Musculoskeletal: He exhibits no edema.  Neurological: He is alert and oriented to person, place, and time. He displays no atrophy and no tremor. He exhibits normal muscle tone. He displays no seizure activity. Gait abnormal.  Minor difficulty with toe walking, otherwise unremarkable gait  Skin:  Skin is warm and dry.  Psychiatric: He has a normal mood and affect.  Nursing note and vitals reviewed.    ED Treatments / Results  Labs (all labs ordered are listed, but only abnormal results are displayed) Labs Reviewed  COMPREHENSIVE METABOLIC PANEL - Abnormal; Notable for the following components:      Result Value   ALT 15 (*)    Total Bilirubin 1.6 (*)    All other components within normal limits  PROTIME-INR  APTT  CBC  DIFFERENTIAL  TROPONIN I  CBG MONITORING, ED    EKG  EKG Interpretation  Date/Time:  Sunday April 05 2017 11:04:22 EST Ventricular Rate:  61 PR Interval:  164 QRS Duration: 102 QT Interval:  406 QTC Calculation: 408 R Axis:   -30 Text Interpretation:  Normal sinus rhythm Left axis deviation Non-specific intra-ventricular conduction delay Artifact Abnormal ekg Confirmed by Carmin Muskrat [phone]) on 04/05/2017 3:07:23 PM       Radiology Ct Head Wo Contrast  Result Date: 04/05/2017 CLINICAL DATA:  Per family patient went to visit a friend and patient's gait notable gait being unsteady. Patient states feels "weak all over." Maceo Pro stated that he had some facial drooping and confusion. Patient alert and oriented. Family denie.*comment was truncated* EXAM: CT HEAD WITHOUT CONTRAST TECHNIQUE: Contiguous axial images were obtained from the base of the skull through the vertex without intravenous contrast. COMPARISON:  None. FINDINGS: Brain: No acute intracranial hemorrhage. No focal mass lesion. No CT evidence of acute infarction. No midline shift or mass effect. No hydrocephalus. Basilar cisterns are patent. Subcorticalamd white matter hypodensities and deep white matter hypodensities greater on the LEFT. Hypodense extends into the anterior limb of LEFT internal capsule. Vascular: No hyperdense vessel or unexpected calcification. Skull: Normal. Negative for fracture or focal lesion. Sinuses/Orbits: Paranasal sinuses and mastoid air cells are clear. Orbits  are clear. Other: None. IMPRESSION: 1. No acute intracranial findings. 2. 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 Electronically Signed   By: Suzy Bouchard M.D.   On: 04/05/2017 12:32    Procedures Procedures (including critical care time)  Medications Ordered in ED Medications - No data to display   Initial Impression / Assessment and Plan / ED Course  I have reviewed the triage vital signs and the nursing notes.  Pertinent labs & imaging results that were available during my care of  the patient were reviewed by me and considered in my medical decision making (see chart for details).  Update: Patient in no distress, aware of all findings.  This elderly male with a history of CAD, now on Plavix and aspirin presents with gait difficulty for 2 days. Patient is awake and alert, with only trivial gait difficulty with walking on his toes, otherwise reassuring physical exam, neurologic exam, gait capacity. There may have been a TIA, but no evidence for acute sustained symptoms. Patient is also already medically maximally managed for stroke prevention with Plavix and aspirin. No evidence for other acute new pathology including ACS, pneumonia given his reassuring physical exam and labs. Patient discharged in stable condition to continue aspirin Plavix and follow-up with neurology.  Final Clinical Impressions(s) / ED Diagnoses  Gait difficulty   Carmin Muskrat, MD 04/05/17 [id number]

## 2017-04-05 NOTE — Discharge Instructions (Signed)
As discussed, your evaluation today has been largely reassuring.  But, it is important that you monitor your condition carefully, and do not hesitate to return to the ED if you develop new, or concerning changes in your condition. ? ?Otherwise, please follow-up with your physician for appropriate ongoing care. ? ?

## 2017-04-14 DIAGNOSIS — N183 Chronic kidney disease, stage 3 (moderate): Secondary | ICD-10-CM | POA: Diagnosis not present

## 2017-04-14 DIAGNOSIS — E782 Mixed hyperlipidemia: Secondary | ICD-10-CM | POA: Diagnosis not present

## 2017-04-14 DIAGNOSIS — M1990 Unspecified osteoarthritis, unspecified site: Secondary | ICD-10-CM | POA: Diagnosis not present

## 2017-04-14 DIAGNOSIS — L982 Febrile neutrophilic dermatosis [Sweet]: Secondary | ICD-10-CM | POA: Diagnosis not present

## 2017-04-14 DIAGNOSIS — R7301 Impaired fasting glucose: Secondary | ICD-10-CM | POA: Diagnosis not present

## 2017-06-08 ENCOUNTER — Ambulatory Visit: Payer: Self-pay | Admitting: Cardiovascular Disease

## 2017-06-09 ENCOUNTER — Encounter: Payer: Self-pay | Admitting: Cardiovascular Disease

## 2017-07-24 ENCOUNTER — Other Ambulatory Visit: Payer: Self-pay | Admitting: Physician Assistant

## 2017-08-20 ENCOUNTER — Ambulatory Visit (INDEPENDENT_AMBULATORY_CARE_PROVIDER_SITE_OTHER): Payer: Medicare Other | Admitting: Cardiovascular Disease

## 2017-08-20 ENCOUNTER — Encounter: Payer: Self-pay | Admitting: Cardiovascular Disease

## 2017-08-20 VITALS — BP 138/78 | HR 75 | Ht 68.0 in | Wt 193.0 lb

## 2017-08-20 DIAGNOSIS — I25118 Atherosclerotic heart disease of native coronary artery with other forms of angina pectoris: Secondary | ICD-10-CM | POA: Diagnosis not present

## 2017-08-20 DIAGNOSIS — Z955 Presence of coronary angioplasty implant and graft: Secondary | ICD-10-CM

## 2017-08-20 DIAGNOSIS — I10 Essential (primary) hypertension: Secondary | ICD-10-CM

## 2017-08-20 DIAGNOSIS — E785 Hyperlipidemia, unspecified: Secondary | ICD-10-CM

## 2017-08-20 MED ORDER — CLOPIDOGREL BISULFATE 75 MG PO TABS: 75.0000 mg | ORAL_TABLET | Freq: Every day | ORAL | 3 refills | Status: DC

## 2017-08-20 NOTE — Patient Instructions (Addendum)
Your physician wants you to follow-up in: December  with Dr.Koneswaran You will receive a reminder letter in the mail two months in advance. If you don't receive a letter, please call our office to schedule the follow-up appointment.     Your physician recommends that you continue on your current medications as directed. Please refer to the Current Medication list given to you today.    If you need a refill on your cardiac medications before your next appointment, please call your pharmacy.      No lab work or tests ordered today.      Thank you for choosing [facility] !

## 2017-08-20 NOTE — Addendum Note (Signed)
Addended by: Barbarann Ehlers A on: 08/20/2017 01:37 PM   Modules accepted: Orders

## 2017-08-20 NOTE — Progress Notes (Signed)
SUBJECTIVE: The patient presents for follow-up of coronary artery disease.  In November 2018, he was found to have severe two-vessel coronary artery disease with chronic total occlusion of the RCA and severe stenosis of the proximal and mid LAD.  He underwent successful PCI of the LAD lesions with a single drug-eluting stent.  The patient denies any symptoms of chest pain, palpitations, shortness of breath, lightheadedness, dizziness, leg swelling, orthopnea, PND, and syncope.  About a month ago, he said he stood up too fast and got lightheaded and dizzy and fell and injured his right eye.  He continues to have some right eye redness.  He was told he had inner ear problems by his PCP.  He denies hearing loss in his right ear.  He also denies loss of consciousness.  Social history: His power of attorney, Pamalee Leyden, is also my patient.  He has known her since she was a child and her deceased father.     Review of Systems: As per "subjective", otherwise negative.  No Known Allergies  Current Outpatient Medications  Medication Sig Dispense Refill  . acetaminophen (TYLENOL) 500 MG tablet Take 1,000 mg every 6 (six) hours as needed by mouth for moderate pain or headache.    Marland Kitchen amLODipine-valsartan (EXFORGE) 5-160 MG per tablet Take 0.5 tablets daily by mouth.     Marland Kitchen aspirin EC 81 MG tablet Take 81 mg by mouth daily.    Marland Kitchen atorvastatin (LIPITOR) 80 MG tablet TAKE 1 TABLET(80 MG) BY MOUTH DAILY AT 6 PM 30 tablet 0  . carvedilol (COREG) 3.125 MG tablet Take 1 tablet (3.125 mg total) 2 (two) times daily with a meal by mouth. 60 tablet 6  . clopidogrel (PLAVIX) 75 MG tablet Take 1 tablet (75 mg total) daily with breakfast by mouth. 90 tablet 3  . diazepam (VALIUM) 10 MG tablet Take 10 mg by mouth 3 (three) times daily as needed for anxiety.    Marland Kitchen escitalopram (LEXAPRO) 10 MG tablet Take 10 mg daily by mouth.     . levothyroxine (SYNTHROID, LEVOTHROID) 50 MCG tablet Take 50 mcg daily before  breakfast by mouth.    . Naphazoline HCl (CLEAR EYES OP) Place 2 drops as needed into both eyes (for dry eyes).    . nitroGLYCERIN (NITROSTAT) 0.4 MG SL tablet Place 1 tablet (0.4 mg total) under the tongue every 5 (five) minutes as needed. (Patient taking differently: Place 0.4 mg every 5 (five) minutes as needed under the tongue for chest pain. ) 25 tablet 3  . potassium chloride SA (K-DUR,KLOR-CON) 20 MEQ tablet Take 2 tablets (40 mEq total) by mouth daily. 180 tablet 3  . traMADol (ULTRAM) 50 MG tablet Take 50 mg every 6 (six) hours as needed by mouth for moderate pain.      No current facility-administered medications for this visit.     Past Medical History:  Diagnosis Date  . Diabetes mellitus   . HOH (hard of hearing)   . Hypertension   . Kidney stone   . Recurrent kidney stones     Past Surgical History:  Procedure Laterality Date  . COLONOSCOPY  07/15/2011   Procedure: COLONOSCOPY;  Surgeon: Jamesetta So, MD;  [facility];  Service: Gastroenterology;  Laterality: N/A;  . CORONARY STENT INTERVENTION N/A 01/02/2017   Procedure: CORONARY STENT INTERVENTION;  Surgeon: [name], MD;  Location: [place] [facility];  Service: Cardiovascular;  Laterality: N/A;  . CYSTOSCOPY W/ RETROGRADES Right  01/25/2013   Procedure: CYSTOSCOPY WITH RIGHT RETROGRADE PYELOGRAM, BALLOON DILATION RIGHT URETER AND RIGHT URETERAL STENT PLACEMENT;  Surgeon: Marissa Nestle, MD;  Location: [facility];  Service: Urology;  Laterality: Right;  . CYSTOSCOPY W/ URETERAL STENT PLACEMENT Left 12/28/2012   Procedure: CYSTOSCOPY WITH RETROGRADE PYELOGRAM/LEFT URETERAL STENT PLACEMENT;  Surgeon: Marissa Nestle, MD;  Location: [facility];  Service: Urology;  Laterality: Left;  . CYSTOSCOPY W/ URETERAL STENT REMOVAL Left 01/25/2013   Procedure: CYSTOSCOPY WITH LEFT URETERALSTENT REMOVAL;  Surgeon: Marissa Nestle, MD;  [facility];  Service: Urology;  Laterality: Left;  . EXTRACORPOREAL SHOCK WAVE  LITHOTRIPSY Right 01/05/2013   Procedure: EXTRACORPOREAL SHOCK WAVE LITHOTRIPSY (ESWL) RIGHT URETERAL CALCULUS;  Surgeon: Marissa Nestle, MD;  [facility];  Service: Urology;  Laterality: Right;  . EXTRACORPOREAL SHOCK WAVE LITHOTRIPSY Right 02/02/2013   Procedure: EXTRACORPOREAL SHOCK WAVE LITHOTRIPSY (ESWL) RIGHT URETERAL CALCULUS;  Surgeon: Marissa Nestle, MD;  [facility];  Service: Urology;  Laterality: Right;  . EXTRACORPOREAL SHOCK WAVE LITHOTRIPSY Right 05/25/2013   Procedure: EXTRACORPOREAL SHOCK WAVE LITHOTRIPSY (ESWL) RIGHT URETERAL CALCULUS;  Surgeon: Marissa Nestle, MD;  [facility];  Service: Urology;  Laterality: Right;  . HOLMIUM LASER APPLICATION Left 19/07/2227   Procedure: HOLMIUM LASER APPLICATION;  Surgeon: Marissa Nestle, MD;  [facility];  Service: Urology;  Laterality: Left;  . INTRAVASCULAR PRESSURE WIRE/FFR STUDY N/A 01/02/2017   Procedure: INTRAVASCULAR PRESSURE WIRE/FFR STUDY;  Surgeon: [name], MD;  Location: [place] [facility];  Service: Cardiovascular;  Laterality: N/A;  . LEFT HEART CATH AND CORONARY ANGIOGRAPHY N/A 01/02/2017   Procedure: LEFT HEART CATH AND CORONARY ANGIOGRAPHY;  Surgeon: [name], MD;  Location: [place] [facility];  Service: Cardiovascular;  Laterality: N/A;  . LITHOTRIPSY     X 2  . LITHOTRIPSY    . STONE EXTRACTION WITH BASKET Left 12/28/2012   Procedure: STONE EXTRACTION WITH BASKET;  Surgeon: Marissa Nestle, MD;  Location: [facility];  Service: Urology;  Laterality: Left;    Social History   Socioeconomic History  . Marital status: Single    Spouse name: Not on file  . Number of children: Not on file  . Years of education: Not on file  . Highest education level: Not on file  Occupational History  . Not on file  Social Needs  . Financial resource strain: Not on file  . Food insecurity:    Worry: Not on file    Inability: Not on file  . Transportation needs:    Medical: Not on file     Non-medical: Not on file  Tobacco Use  . Smoking status: Former Smoker    Types: Cigarettes    Last attempt to quit: 02/25/1980    Years since quitting: 37.5  . Smokeless tobacco: Former Systems developer    Types: Snuff, Chew  Substance and Sexual Activity  . Alcohol use: No  . Drug use: No  . Sexual activity: Not Currently  Lifestyle  . Physical activity:    Days per week: Not on file    Minutes per session: Not on file  . Stress: Not on file  Relationships  . Social connections:    Talks on phone: Not on file    Gets together: Not on file    Attends religious service: Not on file    Active member of club or organization: Not on file    Attends meetings of clubs or organizations: Not on file  Relationship status: Not on file  . Intimate partner violence:    Fear of current or ex partner: Not on file    Emotionally abused: Not on file    Physically abused: Not on file    Forced sexual activity: Not on file  Other Topics Concern  . Not on file  Social History Narrative   ** Merged History Encounter **         Vitals:   08/20/17 1307  BP: 138/78  Pulse: 75  SpO2: 96%  Weight: 193 lb (87.5 kg)  Height: 5\' 8"  (1.727 m)    Wt Readings from Last 3 Encounters:  08/20/17 193 lb (87.5 kg)  04/05/17 185 lb (83.9 kg)  01/26/17 185 lb (83.9 kg)     PHYSICAL EXAM General: NAD HEENT: Normal. Neck: No JVD, no thyromegaly. Lungs: Clear to auscultation bilaterally with normal respiratory effort. CV: Regular rate and rhythm, normal S1/S2, no Y7/C6, soft 1/6 systolicmurmur over RUSB. No pretibial or periankle edema.  No carotid bruit.   Abdomen: Soft, nontender, no distention.  Neurologic: Alert and oriented.  Psych: Normal affect. Skin: Normal. Musculoskeletal: No gross deformities.    ECG: Reviewed above under Subjective   Labs: Lab Results  Component Value Date/Time   K 4.2 04/05/2017 12:42 PM   K 3.7 11/17/2013 05:52 PM   BUN 13 04/05/2017 12:42 PM   BUN 11  11/17/2013 05:52 PM   CREATININE 0.99 04/05/2017 12:42 PM   CREATININE 1.37 (H) 11/17/2013 05:52 PM   ALT 15 (L) 04/05/2017 12:42 PM   ALT 13 (L) 11/17/2013 05:52 PM   TSH 3.86 11/17/2013 05:52 PM   HGB 14.0 04/05/2017 12:42 PM   HGB 13.1 11/17/2013 05:52 PM     Lipids: No results found for: LDLCALC, LDLDIRECT, CHOL, TRIG, HDL     ASSESSMENT AND PLAN:  1.  Coronary artery disease status post PCI of the LAD with chronic total occlusion of the RCA: Symptomatically stable.  Continue dual antiplatelet therapy through 01/02/2018.  Continue Lipitor and carvedilol.  2.  Chronic hypertension:  Blood pressure is normal.  No changes to therapy.  3.  Hypercholesterolemia: Continue high intensity statin therapy with Lipitor 80 mg.  I will obtain a copy of lipids from PCP.     Disposition: Follow up December 2019   Kate Sable, M.D., F.A.C.C.

## 2017-09-03 ENCOUNTER — Telehealth: Payer: Self-pay

## 2017-09-03 NOTE — Telephone Encounter (Signed)
Pt.notified

## 2017-09-03 NOTE — Telephone Encounter (Signed)
That would be fine 

## 2017-09-03 NOTE — Telephone Encounter (Signed)
Called pt to verify that he is currently taking his Lipitor 80 mg daily. He stated for the last few months he has not taken it like he should. I reminded him of how important it is that he take it daily. He stated he will do better. He is not interested in starting the [place] at this time. He asked that we obtain lipids closer to next office visit and decide then. Please advise.

## 2017-09-04 DIAGNOSIS — I10 Essential (primary) hypertension: Secondary | ICD-10-CM | POA: Diagnosis not present

## 2017-09-04 DIAGNOSIS — Z1389 Encounter for screening for other disorder: Secondary | ICD-10-CM | POA: Diagnosis not present

## 2017-09-04 DIAGNOSIS — E063 Autoimmune thyroiditis: Secondary | ICD-10-CM | POA: Diagnosis not present

## 2017-09-04 DIAGNOSIS — S0501XA Injury of conjunctiva and corneal abrasion without foreign body, right eye, initial encounter: Secondary | ICD-10-CM | POA: Diagnosis not present

## 2017-09-07 DIAGNOSIS — R7301 Impaired fasting glucose: Secondary | ICD-10-CM | POA: Diagnosis not present

## 2017-09-07 DIAGNOSIS — E785 Hyperlipidemia, unspecified: Secondary | ICD-10-CM | POA: Diagnosis not present

## 2017-09-07 DIAGNOSIS — Z125 Encounter for screening for malignant neoplasm of prostate: Secondary | ICD-10-CM | POA: Diagnosis not present

## 2017-09-07 DIAGNOSIS — E782 Mixed hyperlipidemia: Secondary | ICD-10-CM | POA: Diagnosis not present

## 2017-09-24 ENCOUNTER — Other Ambulatory Visit: Payer: Self-pay

## 2017-09-24 MED ORDER — ATORVASTATIN CALCIUM 80 MG PO TABS: ORAL_TABLET | ORAL | 0 refills | Status: DC

## 2017-09-24 NOTE — Telephone Encounter (Signed)
Refilled atorvastatin 80 mg #90 per fax results

## 2017-10-31 ENCOUNTER — Emergency Department (HOSPITAL_COMMUNITY): Payer: Medicare Other

## 2017-10-31 ENCOUNTER — Emergency Department (HOSPITAL_COMMUNITY)
Admission: EM | Admit: 2017-10-31 | Discharge: 2017-10-31 | Disposition: A | Discharge status: Home / Self Care | Payer: Medicare Other | Attending: Emergency Medicine | Admitting: Emergency Medicine

## 2017-10-31 ENCOUNTER — Other Ambulatory Visit: Payer: Self-pay

## 2017-10-31 ENCOUNTER — Encounter (HOSPITAL_COMMUNITY): Payer: Self-pay | Admitting: Emergency Medicine

## 2017-10-31 DIAGNOSIS — Z7982 Long term (current) use of aspirin: Secondary | ICD-10-CM | POA: Diagnosis not present

## 2017-10-31 DIAGNOSIS — Z87891 Personal history of nicotine dependence: Secondary | ICD-10-CM | POA: Diagnosis not present

## 2017-10-31 DIAGNOSIS — E119 Type 2 diabetes mellitus without complications: Secondary | ICD-10-CM | POA: Diagnosis not present

## 2017-10-31 DIAGNOSIS — J40 Bronchitis, not specified as acute or chronic: Secondary | ICD-10-CM | POA: Diagnosis not present

## 2017-10-31 DIAGNOSIS — I10 Essential (primary) hypertension: Secondary | ICD-10-CM | POA: Diagnosis not present

## 2017-10-31 DIAGNOSIS — I2510 Atherosclerotic heart disease of native coronary artery without angina pectoris: Secondary | ICD-10-CM | POA: Diagnosis not present

## 2017-10-31 DIAGNOSIS — Z955 Presence of coronary angioplasty implant and graft: Secondary | ICD-10-CM | POA: Insufficient documentation

## 2017-10-31 DIAGNOSIS — Z7902 Long term (current) use of antithrombotics/antiplatelets: Secondary | ICD-10-CM | POA: Insufficient documentation

## 2017-10-31 DIAGNOSIS — J209 Acute bronchitis, unspecified: Secondary | ICD-10-CM | POA: Insufficient documentation

## 2017-10-31 DIAGNOSIS — Z79899 Other long term (current) drug therapy: Secondary | ICD-10-CM | POA: Diagnosis not present

## 2017-10-31 DIAGNOSIS — R05 Cough: Secondary | ICD-10-CM | POA: Diagnosis not present

## 2017-10-31 LAB — CBC WITH DIFFERENTIAL/PLATELET
BASOS PCT: 0 %
Basophils Absolute: 0 10*3/uL (ref 0.0–0.1)
Eosinophils Absolute: 0.1 10*3/uL (ref 0.0–0.7)
Eosinophils Relative: 1 %
HEMATOCRIT: 38.1 % — AB (ref 39.0–52.0)
Hemoglobin: 12.7 g/dL — ABNORMAL LOW (ref 13.0–17.0)
LYMPHS ABS: 0.8 10*3/uL (ref 0.7–4.0)
LYMPHS PCT: 14 %
MCH: 30.8 pg (ref 26.0–34.0)
MCHC: 33.3 g/dL (ref 30.0–36.0)
MCV: 92.3 fL (ref 78.0–100.0)
MONO ABS: 0.4 10*3/uL (ref 0.1–1.0)
MONOS PCT: 7 %
NEUTROS ABS: 4.4 10*3/uL (ref 1.7–7.7)
NEUTROS PCT: 78 %
Platelets: 161 10*3/uL (ref 150–400)
RBC: 4.13 MIL/uL — ABNORMAL LOW (ref 4.22–5.81)
RDW: 14.7 % (ref 11.5–15.5)
WBC: 5.7 10*3/uL (ref 4.0–10.5)

## 2017-10-31 LAB — BASIC METABOLIC PANEL
ANION GAP: 10 (ref 5–15)
BUN: 11 mg/dL (ref 8–23)
CALCIUM: 9.1 mg/dL (ref 8.9–10.3)
CHLORIDE: 104 mmol/L (ref 98–111)
CO2: 25 mmol/L (ref 22–32)
Creatinine, Ser: 1.08 mg/dL (ref 0.61–1.24)
GFR calc non Af Amer: >60 mL/min (ref >60)
GLUCOSE: 133 mg/dL — AB (ref 70–99)
POTASSIUM: 3.2 mmol/L — AB (ref 3.5–5.1)
Sodium: 139 mmol/L (ref 135–145)

## 2017-10-31 LAB — TROPONIN I: Troponin I: <0.03 ng/mL (ref <0.03)

## 2017-10-31 MED ORDER — DOXYCYCLINE HYCLATE 100 MG PO CAPS: 100.0000 mg | ORAL_CAPSULE | Freq: Two times a day (BID) | ORAL | 0 refills | Status: DC

## 2017-10-31 MED ORDER — BENZONATATE 200 MG PO CAPS: 200.0000 mg | ORAL_CAPSULE | Freq: Three times a day (TID) | ORAL | 0 refills | Status: DC | PRN

## 2017-10-31 MED ORDER — ALBUTEROL SULFATE HFA 108 (90 BASE) MCG/ACT IN AERS
2.0000 | INHALATION_SPRAY | Freq: Once | RESPIRATORY_TRACT | Status: DC
  Filled 2017-10-31: qty 6.7

## 2017-10-31 NOTE — ED Notes (Signed)
RT came to instruct pt on inhaler use.  Pt not in room and gown on bed.  Assuming left.

## 2017-10-31 NOTE — ED Triage Notes (Signed)
Pt reports cough x 3 weeks.  States it was productive, but now is just dry.  Was feeling better for a few days, but came back worse.

## 2017-10-31 NOTE — ED Notes (Signed)
Call to resp 

## 2017-10-31 NOTE — Discharge Instructions (Signed)
1 to 2 puffs of the albuterol inhaler every 4-6 hours as needed.  Take the antibiotic as directed until its finished.  Follow-up with your primary doctor next week for recheck, return to the ER for any worsening symptoms.

## 2017-10-31 NOTE — ED Provider Notes (Signed)
[facility] EMERGENCY DEPARTMENT Provider Note   CSN: [id number] Arrival date & time: 10/31/17  0840     History   Chief Complaint Chief Complaint  Patient presents with  . Cough    HPI [name] is a 72 y.o. male.  HPI   [name] is a 72 y.o. male who presents to the Emergency Department complaining of cough for 3 weeks.  He states the cough was initially productive of yellow sputum, but now dry nonproductive.  His symptoms have been waxing and waning.  Cough worse since yesterday.  He states that he coughed all last evening which prompted him to come to the emergency room today.  He denies fever or chills, chest pain, shortness of breath and swelling of his extremities.  He is a non-smoker.  He has not tried any over-the-counter cough medication.    Past Medical History:  Diagnosis Date  . Diabetes mellitus   . HOH (hard of hearing)   . Hypertension   . Kidney stone   . Recurrent kidney stones     Patient Active Problem List   Diagnosis Date Noted  . Coronary artery disease with exertional angina ([place]) 01/02/2017    Past Surgical History:  Procedure Laterality Date  . COLONOSCOPY  07/15/2011   Procedure: COLONOSCOPY;  Surgeon: Jamesetta So, MD;  [facility];  Service: Gastroenterology;  Laterality: N/A;  . CORONARY STENT INTERVENTION N/A 01/02/2017   Procedure: CORONARY STENT INTERVENTION;  Surgeon: [name], MD;  Location: [place] CV LAB;  Service: Cardiovascular;  Laterality: N/A;  . CYSTOSCOPY W/ RETROGRADES Right 01/25/2013   Procedure: CYSTOSCOPY WITH RIGHT RETROGRADE PYELOGRAM, BALLOON DILATION RIGHT URETER AND RIGHT URETERAL STENT PLACEMENT;  Surgeon: Marissa Nestle, MD;  Location: [facility] ORS;  Service: Urology;  Laterality: Right;  . CYSTOSCOPY W/ URETERAL STENT PLACEMENT Left 12/28/2012   Procedure: CYSTOSCOPY WITH RETROGRADE PYELOGRAM/LEFT URETERAL STENT PLACEMENT;  Surgeon: Marissa Nestle, MD;  Location: [facility] ORS;   Service: Urology;  Laterality: Left;  . CYSTOSCOPY W/ URETERAL STENT REMOVAL Left 01/25/2013   Procedure: CYSTOSCOPY WITH LEFT URETERALSTENT REMOVAL;  Surgeon: Marissa Nestle, MD;  Location: [facility] ORS;  Service: Urology;  Laterality: Left;  . EXTRACORPOREAL SHOCK WAVE LITHOTRIPSY Right 01/05/2013   Procedure: EXTRACORPOREAL SHOCK WAVE LITHOTRIPSY (ESWL) RIGHT URETERAL CALCULUS;  Surgeon: Marissa Nestle, MD;  Location: [facility] ORS;  Service: Urology;  Laterality: Right;  . EXTRACORPOREAL SHOCK WAVE LITHOTRIPSY Right 02/02/2013   Procedure: EXTRACORPOREAL SHOCK WAVE LITHOTRIPSY (ESWL) RIGHT URETERAL CALCULUS;  Surgeon: Marissa Nestle, MD;  Location: [facility] ORS;  Service: Urology;  Laterality: Right;  . EXTRACORPOREAL SHOCK WAVE LITHOTRIPSY Right 05/25/2013   Procedure: EXTRACORPOREAL SHOCK WAVE LITHOTRIPSY (ESWL) RIGHT URETERAL CALCULUS;  Surgeon: Marissa Nestle, MD;  Location: [facility] ORS;  Service: Urology;  Laterality: Right;  . HOLMIUM LASER APPLICATION Left 40/10/8117   Procedure: HOLMIUM LASER APPLICATION;  Surgeon: Marissa Nestle, MD;  Location: [facility] ORS;  Service: Urology;  Laterality: Left;  . INTRAVASCULAR PRESSURE WIRE/FFR STUDY N/A 01/02/2017   Procedure: INTRAVASCULAR PRESSURE WIRE/FFR STUDY;  Surgeon: [name], MD;  Location: [place] CV LAB;  Service: Cardiovascular;  Laterality: N/A;  . LEFT HEART CATH AND CORONARY ANGIOGRAPHY N/A 01/02/2017   Procedure: LEFT HEART CATH AND CORONARY ANGIOGRAPHY;  Surgeon: [name], MD;  Location: [place] CV LAB;  Service: Cardiovascular;  Laterality: N/A;  . LITHOTRIPSY     X 2  . LITHOTRIPSY    . STONE  EXTRACTION WITH BASKET Left 12/28/2012   Procedure: STONE EXTRACTION WITH BASKET;  Surgeon: Marissa Nestle, MD;  Location: [facility] ORS;  Service: Urology;  Laterality: Left;        Home Medications    Prior to Admission medications   Medication Sig Start Date End Date Taking? Authorizing Provider  acetaminophen (TYLENOL) 500 MG  tablet Take 1,000 mg every 6 (six) hours as needed by mouth for moderate pain or headache.    [provider]  amLODipine-valsartan (EXFORGE) 5-160 MG per tablet Take 0.5 tablets daily by mouth.     [provider]  aspirin EC 81 MG tablet Take 81 mg by mouth daily.    [provider]  atorvastatin (LIPITOR) 80 MG tablet TAKE 1 TABLET(80 MG) BY MOUTH DAILY AT 6 PM 09/24/17   Herminio Commons, MD  carvedilol (COREG) 3.125 MG tablet Take 1 tablet (3.125 mg total) 2 (two) times daily with a meal by mouth. 01/03/17   Bhagat, Bhavinkumar, PA  clopidogrel (PLAVIX) 75 MG tablet Take 1 tablet (75 mg total) by mouth daily with breakfast. 08/20/17   Herminio Commons, MD  diazepam (VALIUM) 10 MG tablet Take 10 mg by mouth 3 (three) times daily as needed for anxiety.    [provider]  escitalopram (LEXAPRO) 10 MG tablet Take 10 mg daily by mouth.     [provider]  levothyroxine (SYNTHROID, LEVOTHROID) 50 MCG tablet Take 50 mcg daily before breakfast by mouth.    [provider]  Naphazoline HCl (CLEAR EYES OP) Place 2 drops as needed into both eyes (for dry eyes).    [provider]  nitroGLYCERIN (NITROSTAT) 0.4 MG SL tablet Place 1 tablet (0.4 mg total) under the tongue every 5 (five) minutes as needed. Patient taking differently: Place 0.4 mg every 5 (five) minutes as needed under the tongue for chest pain.  12/04/15   Herminio Commons, MD  potassium chloride SA (K-DUR,KLOR-CON) 20 MEQ tablet Take 2 tablets (40 mEq total) by mouth daily. 12/24/16   Herminio Commons, MD  traMADol (ULTRAM) 50 MG tablet Take 50 mg every 6 (six) hours as needed by mouth for moderate pain.     [provider]    Family History Family History  Problem Relation Age of Onset  . Stroke Mother   . Heart attack Father   . Colon cancer Paternal Uncle     Social History Social History   Tobacco Use  . Smoking status: Former Smoker     Types: Cigarettes    Last attempt to quit: 02/25/1980    Years since quitting: 37.7  . Smokeless tobacco: Former Systems developer    Types: Snuff, Chew  Substance Use Topics  . Alcohol use: No  . Drug use: No     Allergies   Patient has no known allergies.   Review of Systems Review of Systems  Constitutional: Negative for appetite change, chills and fever.  HENT: Negative for congestion, sore throat and trouble swallowing.   Eyes: Negative for visual disturbance.  Respiratory: Positive for cough. Negative for chest tightness, shortness of breath and wheezing.   Cardiovascular: Negative for chest pain and leg swelling.  Gastrointestinal: Negative for abdominal pain, nausea and vomiting.  Genitourinary: Negative for dysuria.  Musculoskeletal: Negative for arthralgias.  Skin: Negative for rash.  Neurological: Negative for dizziness, syncope, speech difficulty, weakness, numbness and headaches.  Hematological: Negative for adenopathy.  All other systems reviewed and are negative.  Physical Exam Updated Vital Signs BP (!) 156/90 (BP Location: Right Arm)   Pulse 85   Temp 98 F (36.7 C) (Oral)   Resp 17   Ht 5\' 8"  (1.727 m)   Wt 83 kg   SpO2 99%   BMI 27.83 kg/m   Physical Exam  Constitutional: He is oriented to person, place, and time. He appears well-developed and well-nourished. No distress.  HENT:  Head: Normocephalic and atraumatic.  Right Ear: Tympanic membrane and ear canal normal.  Left Ear: Tympanic membrane and ear canal normal.  Mouth/Throat: Uvula is midline, oropharynx is clear and moist and mucous membranes are normal. No oropharyngeal exudate.  Eyes: Pupils are equal, round, and reactive to light. EOM are normal.  Neck: Normal range of motion, full passive range of motion without pain and phonation normal. Neck supple.  Cardiovascular: Normal rate, regular rhythm and intact distal pulses.  No murmur heard. Pulmonary/Chest: Effort normal and breath sounds normal.  No stridor. No respiratory distress. He has no wheezes. He has no rales. He exhibits no tenderness.  Musculoskeletal: He exhibits no edema.  Lymphadenopathy:    He has no cervical adenopathy.  Neurological: He is alert and oriented to person, place, and time. He exhibits normal muscle tone. Coordination normal.  Skin: Skin is warm and dry. Capillary refill takes less than 2 seconds.  Psychiatric: He has a normal mood and affect.  Nursing note and vitals reviewed.    ED Treatments / Results  Labs (all labs ordered are listed, but only abnormal results are displayed) Labs Reviewed  CBC WITH DIFFERENTIAL/PLATELET - Abnormal; Notable for the following components:      Result Value   RBC 4.13 (*)    Hemoglobin 12.7 (*)    HCT 38.1 (*)    All other components within normal limits  BASIC METABOLIC PANEL  TROPONIN I    EKG EKG Interpretation  Date/Time:  Saturday October 31 2017 10:15:47 EDT Ventricular Rate:  68 PR Interval:    QRS Duration: 135 QT Interval:  415 QTC Calculation: 442 R Axis:   -23 Text Interpretation:  Sinus rhythm Right bundle branch block When compared with ECG of 01/03/2017 No significant change was found Confirmed by Francine Graven [phone]) on 10/31/2017 10:28:13 AM   Radiology Dg Chest 2 View  Result Date: 10/31/2017 CLINICAL DATA:  Cough. EXAM: CHEST - 2 VIEW COMPARISON:  December 24, 2016 FINDINGS: The heart, hila, and mediastinum are normal. No pneumothorax. No nodules or masses. No focal infiltrates identified. IMPRESSION: No active cardiopulmonary disease. Electronically Signed   By: Dorise Bullion III M.D   On: 10/31/2017 09:35    Procedures Procedures (including critical care time)  Medications Ordered in ED Medications - No data to display   Initial Impression / Assessment and Plan / ED Course  I have reviewed the triage vital signs and the nursing notes.  Pertinent labs & imaging results that were available during my care of the patient  were reviewed by me and considered in my medical decision making (see chart for details).     Vitals reviewed.  Patient is well-appearing and talkative.  No respiratory distress.  Lungs are clear to auscultation bilaterally.  Work-up here reassuring. Pt appropriate for d/c home, return precautions discussed.   Final Clinical Impressions(s) / ED Diagnoses   Final diagnoses:  Bronchitis    ED Discharge Orders         Ordered    doxycycline (VIBRAMYCIN) 100 MG capsule  2 times  daily     10/31/17 1123    benzonatate (TESSALON) 200 MG capsule  3 times daily PRN     10/31/17 1125           Zianne Schubring, Lynelle Smoke, PA-C 11/01/17 2108    Francine Graven, DO 11/03/17 1007

## 2017-11-02 ENCOUNTER — Other Ambulatory Visit: Payer: Self-pay

## 2017-12-17 DIAGNOSIS — Z0001 Encounter for general adult medical examination with abnormal findings: Secondary | ICD-10-CM | POA: Diagnosis not present

## 2017-12-17 DIAGNOSIS — Z23 Encounter for immunization: Secondary | ICD-10-CM | POA: Diagnosis not present

## 2017-12-29 ENCOUNTER — Other Ambulatory Visit: Payer: Self-pay | Admitting: Cardiovascular Disease

## 2017-12-30 ENCOUNTER — Other Ambulatory Visit: Payer: Self-pay | Admitting: Physician Assistant

## 2018-01-19 ENCOUNTER — Other Ambulatory Visit: Payer: Self-pay

## 2018-01-19 NOTE — Patient Outreach (Signed)
[facility]) Care Management  01/19/2018  MARCIANO MUNDT 12/06/1945 [id number]   Medication Adherence call to Mr. Tayvion Lauder patients telephone number is busy all time not sure if this is a correct telephone number patient is due on Atorvastatin 80 mg. Mr. Catterton is showing past due under [place].   [place] Management Direct Dial [phone]  Fax [phone] [email or web address]

## 2018-02-01 ENCOUNTER — Other Ambulatory Visit: Payer: Self-pay

## 2018-02-01 NOTE — Patient Outreach (Signed)
[facility]) Care Management  02/01/2018  [name] 07-09-1945 [id number]   Medication Adherence call to Mr. [name] patient is due on Atorvastatin 80 mg patient did not answer phone number busy all the time. [name] is showing past due under [place].    [place] Management Direct Dial [phone]  Fax [phone] [name]

## 2018-04-02 ENCOUNTER — Ambulatory Visit: Payer: Self-pay | Admitting: Cardiovascular Disease

## 2018-04-05 ENCOUNTER — Telehealth: Payer: Self-pay | Admitting: Cardiovascular Disease

## 2018-04-05 ENCOUNTER — Encounter: Payer: Self-pay | Admitting: Cardiovascular Disease

## 2018-04-05 NOTE — Telephone Encounter (Signed)
Error

## 2018-06-12 ENCOUNTER — Other Ambulatory Visit: Payer: Self-pay | Admitting: Cardiovascular Disease

## 2018-10-01 ENCOUNTER — Other Ambulatory Visit: Payer: Self-pay | Admitting: Cardiovascular Disease

## 2018-10-10 ENCOUNTER — Other Ambulatory Visit: Payer: Self-pay | Admitting: Cardiovascular Disease

## 2018-10-10 ENCOUNTER — Other Ambulatory Visit: Payer: Self-pay | Admitting: Physician Assistant

## 2018-11-10 ENCOUNTER — Other Ambulatory Visit: Payer: Self-pay

## 2018-11-10 NOTE — Patient Outreach (Signed)
[facility]) Care Management  11/10/2018  MORRISSEY LENHART [birth year] [id number]  Medication Adherence call to Mr. Zakaree Finkel HIPPA Compliant Voice message left with a call back number. Mr. Dambrosi is showing past due on Amlodipine/Valsartan 5/160 mg under [place].  [place] Management Direct Dial [phone]  Fax [phone] [email or web address]

## 2019-04-04 DIAGNOSIS — R7309 Other abnormal glucose: Secondary | ICD-10-CM | POA: Diagnosis not present

## 2019-04-04 DIAGNOSIS — Z1389 Encounter for screening for other disorder: Secondary | ICD-10-CM | POA: Diagnosis not present

## 2019-04-04 DIAGNOSIS — I10 Essential (primary) hypertension: Secondary | ICD-10-CM | POA: Diagnosis not present

## 2019-04-04 DIAGNOSIS — Z0001 Encounter for general adult medical examination with abnormal findings: Secondary | ICD-10-CM | POA: Diagnosis not present

## 2019-04-04 DIAGNOSIS — E785 Hyperlipidemia, unspecified: Secondary | ICD-10-CM | POA: Diagnosis not present

## 2019-04-04 DIAGNOSIS — E039 Hypothyroidism, unspecified: Secondary | ICD-10-CM | POA: Diagnosis not present

## 2019-05-10 ENCOUNTER — Other Ambulatory Visit: Payer: Self-pay

## 2019-05-10 NOTE — Patient Outreach (Signed)
[facility]) Care Management  05/10/2019  [name] 25-Jun-1945 [id number]   Medication Adherence call to [name] patient has a disconnected number. [name] is showing past due on Amlodipine/Valsartan 5/160 mg under [place].  [place] Management Direct Dial [phone]  Fax [phone] [email or web address]

## 2019-07-22 DIAGNOSIS — E039 Hypothyroidism, unspecified: Secondary | ICD-10-CM | POA: Diagnosis not present

## 2020-05-10 DIAGNOSIS — I10 Essential (primary) hypertension: Secondary | ICD-10-CM | POA: Diagnosis not present

## 2020-05-10 DIAGNOSIS — Z1389 Encounter for screening for other disorder: Secondary | ICD-10-CM | POA: Diagnosis not present

## 2020-05-10 DIAGNOSIS — E785 Hyperlipidemia, unspecified: Secondary | ICD-10-CM | POA: Diagnosis not present

## 2020-05-10 DIAGNOSIS — Z0001 Encounter for general adult medical examination with abnormal findings: Secondary | ICD-10-CM | POA: Diagnosis not present

## 2020-05-10 DIAGNOSIS — R7309 Other abnormal glucose: Secondary | ICD-10-CM | POA: Diagnosis not present

## 2020-05-10 DIAGNOSIS — E063 Autoimmune thyroiditis: Secondary | ICD-10-CM | POA: Diagnosis not present

## 2020-09-07 DIAGNOSIS — H4051X1 Glaucoma secondary to other eye disorders, right eye, mild stage: Secondary | ICD-10-CM | POA: Diagnosis not present

## 2020-09-12 DIAGNOSIS — E039 Hypothyroidism, unspecified: Secondary | ICD-10-CM | POA: Diagnosis not present

## 2020-09-28 DIAGNOSIS — H4051X1 Glaucoma secondary to other eye disorders, right eye, mild stage: Secondary | ICD-10-CM | POA: Diagnosis not present

## 2020-10-03 DIAGNOSIS — H25812 Combined forms of age-related cataract, left eye: Secondary | ICD-10-CM | POA: Diagnosis not present

## 2020-10-03 DIAGNOSIS — H401121 Primary open-angle glaucoma, left eye, mild stage: Secondary | ICD-10-CM | POA: Diagnosis not present

## 2020-10-12 DIAGNOSIS — Z01818 Encounter for other preprocedural examination: Secondary | ICD-10-CM | POA: Diagnosis not present

## 2020-10-12 DIAGNOSIS — H401111 Primary open-angle glaucoma, right eye, mild stage: Secondary | ICD-10-CM | POA: Diagnosis not present

## 2020-10-12 DIAGNOSIS — H2512 Age-related nuclear cataract, left eye: Secondary | ICD-10-CM | POA: Diagnosis not present

## 2020-10-12 DIAGNOSIS — H401121 Primary open-angle glaucoma, left eye, mild stage: Secondary | ICD-10-CM | POA: Diagnosis not present

## 2020-10-12 DIAGNOSIS — H25812 Combined forms of age-related cataract, left eye: Secondary | ICD-10-CM | POA: Diagnosis not present

## 2020-10-12 DIAGNOSIS — H409 Unspecified glaucoma: Secondary | ICD-10-CM | POA: Diagnosis not present

## 2021-02-01 DIAGNOSIS — H401131 Primary open-angle glaucoma, bilateral, mild stage: Secondary | ICD-10-CM | POA: Diagnosis not present

## 2021-04-08 DIAGNOSIS — H25811 Combined forms of age-related cataract, right eye: Secondary | ICD-10-CM | POA: Diagnosis not present

## 2021-04-08 DIAGNOSIS — H268 Other specified cataract: Secondary | ICD-10-CM | POA: Diagnosis not present

## 2021-04-08 DIAGNOSIS — Z01818 Encounter for other preprocedural examination: Secondary | ICD-10-CM | POA: Diagnosis not present

## 2021-04-29 DIAGNOSIS — H25811 Combined forms of age-related cataract, right eye: Secondary | ICD-10-CM | POA: Diagnosis not present

## 2021-04-29 DIAGNOSIS — H268 Other specified cataract: Secondary | ICD-10-CM | POA: Diagnosis not present

## 2021-08-07 DIAGNOSIS — E063 Autoimmune thyroiditis: Secondary | ICD-10-CM | POA: Diagnosis not present

## 2021-08-07 DIAGNOSIS — R7309 Other abnormal glucose: Secondary | ICD-10-CM | POA: Diagnosis not present

## 2021-08-07 DIAGNOSIS — E039 Hypothyroidism, unspecified: Secondary | ICD-10-CM | POA: Diagnosis not present

## 2021-08-07 DIAGNOSIS — I10 Essential (primary) hypertension: Secondary | ICD-10-CM | POA: Diagnosis not present

## 2021-08-07 DIAGNOSIS — E785 Hyperlipidemia, unspecified: Secondary | ICD-10-CM | POA: Diagnosis not present

## 2021-08-13 ENCOUNTER — Encounter: Payer: Self-pay | Admitting: *Deleted

## 2021-09-23 ENCOUNTER — Other Ambulatory Visit: Payer: Self-pay | Admitting: *Deleted

## 2021-09-23 DIAGNOSIS — Z1211 Encounter for screening for malignant neoplasm of colon: Secondary | ICD-10-CM

## 2021-09-24 ENCOUNTER — Ambulatory Visit: Payer: Medicare Other | Admitting: General Surgery

## 2022-09-08 DIAGNOSIS — J069 Acute upper respiratory infection, unspecified: Secondary | ICD-10-CM | POA: Diagnosis not present

## 2022-09-08 DIAGNOSIS — R6889 Other general symptoms and signs: Secondary | ICD-10-CM | POA: Diagnosis not present

## 2023-06-22 DIAGNOSIS — Z6823 Body mass index (BMI) 23.0-23.9, adult: Secondary | ICD-10-CM | POA: Diagnosis not present

## 2023-06-22 DIAGNOSIS — R6889 Other general symptoms and signs: Secondary | ICD-10-CM | POA: Diagnosis not present

## 2023-06-22 DIAGNOSIS — E785 Hyperlipidemia, unspecified: Secondary | ICD-10-CM | POA: Diagnosis not present

## 2023-06-22 DIAGNOSIS — E039 Hypothyroidism, unspecified: Secondary | ICD-10-CM | POA: Diagnosis not present

## 2023-06-22 DIAGNOSIS — I10 Essential (primary) hypertension: Secondary | ICD-10-CM | POA: Diagnosis not present

## 2023-06-22 DIAGNOSIS — J069 Acute upper respiratory infection, unspecified: Secondary | ICD-10-CM | POA: Diagnosis not present

## 2023-06-22 DIAGNOSIS — E063 Autoimmune thyroiditis: Secondary | ICD-10-CM | POA: Diagnosis not present

## 2023-06-22 DIAGNOSIS — R7309 Other abnormal glucose: Secondary | ICD-10-CM | POA: Diagnosis not present

## 2023-07-27 DIAGNOSIS — I10 Essential (primary) hypertension: Secondary | ICD-10-CM | POA: Diagnosis not present

## 2023-07-27 DIAGNOSIS — Z0001 Encounter for general adult medical examination with abnormal findings: Secondary | ICD-10-CM | POA: Diagnosis not present

## 2023-07-27 DIAGNOSIS — E039 Hypothyroidism, unspecified: Secondary | ICD-10-CM | POA: Diagnosis not present

## 2023-07-27 DIAGNOSIS — R7309 Other abnormal glucose: Secondary | ICD-10-CM | POA: Diagnosis not present

## 2023-07-27 DIAGNOSIS — N189 Chronic kidney disease, unspecified: Secondary | ICD-10-CM | POA: Diagnosis not present

## 2023-07-30 DIAGNOSIS — Z1211 Encounter for screening for malignant neoplasm of colon: Secondary | ICD-10-CM | POA: Diagnosis not present

## 2023-08-05 ENCOUNTER — Encounter: Payer: Self-pay | Admitting: Internal Medicine

## 2023-08-25 ENCOUNTER — Encounter: Payer: Self-pay | Admitting: Gastroenterology

## 2023-08-25 NOTE — Progress Notes (Unsigned)
 Referring Provider: Marvine Rush, MD  Primary Care Physician:  Marvine Rush, MD Primary Gastroenterologist:  Dr. Shaaron  Chief Complaint  Patient presents with   positive cologuard    Positive cologuard     HPI:   [name] is a 78 y.o. male presenting today at the request of Marvine Rush, MD for positive Cologuard.  Labs included in referral dated 06/23/2023:  CMP remarkable for glucose 137, creatinine 2.09.  CBC remarkable for hemoglobin 9.2 with normocytic indices.  Cologuard result not included.  Last colonoscopy on file 07/15/2011 with Dr. Mavis with 2 polyps in the proximal transverse colon, sessile polyp in the rectum.  Pathology with tubular adenomas and hyperplastic polyp.    Today: Reports positive Cologuard was in June.  Has noted intermittent increased stool frequency but no diarrhea. Used to always have 1 BM per day but sometimes have 3 bowel movements per day. No significant diarrhea.   Heartburn constantly. Tums are not helping. Has tried OTC Prilosec without improvement. Heartburn is chronic. Also with dysphagia. Present x 1 year. With solid foods and occasionally with liquids. In May went a period of time without wanting to eat anything because nothing tasted good and the issues with swallowing. No nausea. No abdominal pain.   Has lost about 30 lbs in the last 8 months or so.  Drinking ensure now.   2 paternal uncles with colon cancer.   Reports he had 1 normal colonoscopy in the past many years ago.   Reports no known history of anemia.  Past Medical History:  Diagnosis Date   CAD (coronary artery disease)    s/p PCI   CKD (chronic kidney disease)    Diabetes mellitus    HLD (hyperlipidemia)    HOH (hard of hearing)    Hypertension    Hypothyroidism    Kidney stone    Recurrent kidney stones     Past Surgical History:  Procedure Laterality Date   COLONOSCOPY  07/15/2011   Procedure: COLONOSCOPY;  Surgeon: Oneil DELENA Mavis, MD;   [facility];  Service: Gastroenterology;  Laterality: N/A;   CORONARY PRESSURE/FFR STUDY N/A 01/02/2017   Procedure: INTRAVASCULAR PRESSURE WIRE/FFR STUDY;  Surgeon: Wonda Sharper, MD;  Location: [facility] INVASIVE CV LAB;  Service: Cardiovascular;  Laterality: N/A;   CORONARY STENT INTERVENTION N/A 01/02/2017   Procedure: CORONARY STENT INTERVENTION;  Surgeon: Wonda Sharper, MD;  Location: [facility] INVASIVE CV LAB;  Service: Cardiovascular;  Laterality: N/A;   CYSTOSCOPY W/ RETROGRADES Right 01/25/2013   Procedure: CYSTOSCOPY WITH RIGHT RETROGRADE PYELOGRAM, BALLOON DILATION RIGHT URETER AND RIGHT URETERAL STENT PLACEMENT;  Surgeon: Mohammad I Javaid, MD;  Location: [facility] OR[facility];  Service: Urology;  Laterality: Right;   CYSTOSCOPY W/ URETERAL STENT PLACEMENT Left 12/28/2012   Procedure: CYSTOSCOPY WITH RETROGRADE PYELOGRAM/LEFT URETERAL STENT PLACEMENT;  Surgeon: Mohammad I Javaid, MD;  Location: [facility] OR[facility];  Service: Urology;  Laterality: Left;   CYSTOSCOPY W/ URETERAL STENT REMOVAL Left 01/25/2013   Procedure: CYSTOSCOPY WITH LEFT URETERALSTENT REMOVAL;  Surgeon: Emery LILLETTE Blaze, MD;  Location: [facility];  Service: Urology;  Laterality: Left;   EXTRACORPOREAL SHOCK WAVE LITHOTRIPSY Right 01/05/2013   Procedure: EXTRACORPOREAL SHOCK WAVE LITHOTRIPSY (ESWL) RIGHT URETERAL CALCULUS;  Surgeon: Emery LILLETTE Blaze, MD;  Location: [facility];  Service: Urology;  Laterality: Right;   EXTRACORPOREAL SHOCK WAVE LITHOTRIPSY Right 02/02/2013   Procedure: EXTRACORPOREAL SHOCK WAVE LITHOTRIPSY (ESWL) RIGHT URETERAL CALCULUS;  Surgeon: Emery LILLETTE Blaze, MD;  Location: [facility];  Service: Urology;  Laterality: Right;   EXTRACORPOREAL SHOCK WAVE LITHOTRIPSY Right 05/25/2013   Procedure: EXTRACORPOREAL SHOCK WAVE LITHOTRIPSY (ESWL) RIGHT URETERAL CALCULUS;  Surgeon: Emery LILLETTE Blaze, MD;  Location: [facility];  Service: Urology;  Laterality: Right;   HOLMIUM LASER APPLICATION Left 12/28/2012   Procedure: HOLMIUM LASER APPLICATION;   Surgeon: Mohammad I Javaid, MD;  Location: [facility];  Service: Urology;  Laterality: Left;   LEFT HEART CATH AND CORONARY ANGIOGRAPHY N/A 01/02/2017   Procedure: LEFT HEART CATH AND CORONARY ANGIOGRAPHY;  Surgeon: Wonda Sharper, MD;  Location: [facility] INVASIVE CV LAB;  Service: Cardiovascular;  Laterality: N/A;   LITHOTRIPSY     X 2   LITHOTRIPSY     STONE EXTRACTION WITH BASKET Left 12/28/2012   Procedure: STONE EXTRACTION WITH BASKET;  Surgeon: Emery LILLETTE Blaze, MD;  Location: [facility];  Service: Urology;  Laterality: Left;    Current Outpatient Medications  Medication Sig Dispense Refill   acetaminophen  (TYLENOL ) 500 MG tablet Take 1,000 mg every 6 (six) hours as needed by mouth for moderate pain or headache.     amLODipine -valsartan  (EXFORGE ) 5-160 MG per tablet Take 0.5 tablets daily by mouth.      aspirin  EC 81 MG tablet Take 81 mg by mouth daily.     atorvastatin  (LIPITOR ) 80 MG tablet TAKE 1 TABLET(80 MG) BY MOUTH DAILY AT 6 PM 90 tablet 3   carvedilol  (COREG ) 3.125 MG tablet TAKE 1 TABLET(3.125 MG) BY MOUTH TWICE DAILY WITH A MEAL 60 tablet 6   clopidogrel  (PLAVIX ) 75 MG tablet Take 1 tablet (75 mg total) by mouth daily with breakfast. 90 tablet 3   clopidogrel  (PLAVIX ) 75 MG tablet TAKE 1 TABLET BY MOUTH EVERY DAY WITH BREAKFAST 90 tablet 3   diazepam  (VALIUM ) 10 MG tablet Take 10 mg by mouth 3 (three) times daily as needed for anxiety.     doxycycline  (VIBRAMYCIN ) 100 MG capsule Take 1 capsule (100 mg total) by mouth 2 (two) times daily. 20 capsule 0   escitalopram  (LEXAPRO ) 10 MG tablet Take 10 mg daily by mouth.      levothyroxine  (SYNTHROID , LEVOTHROID) 50 MCG tablet Take 50 mcg daily before breakfast by mouth.     Naphazoline HCl (CLEAR EYES OP) Place 2 drops as needed into both eyes (for dry eyes).     neomycin-polymyxin b-dexamethasone  (MAXITROL) 3.5-10000-0.1 OINT Place 1 application into the right eye 4 (four) times daily.  0   nitroGLYCERIN  (NITROSTAT ) 0.4 MG SL tablet Place 1  tablet (0.4 mg total) under the tongue every 5 (five) minutes as needed. 25 tablet 3   pantoprazole (PROTONIX) 40 MG tablet Take 1 tablet (40 mg total) by mouth daily before breakfast. 30 tablet 3   potassium chloride  SA (K-DUR) 20 MEQ tablet TAKE 2 TABLETS(40 MEQ) BY MOUTH DAILY 180 tablet 3   benzonatate  (TESSALON ) 200 MG capsule Take 1 capsule (200 mg total) by mouth 3 (three) times daily as needed for cough. Swallow whole, do not chew (Patient not taking: Reported on 08/27/2023) 21 capsule 0   traMADol  (ULTRAM ) 50 MG tablet Take 50 mg every 6 (six) hours as needed by mouth for moderate pain.  (Patient not taking: Reported on 08/27/2023)     No current facility-administered medications for this visit.    Allergies as of 08/27/2023   (No Known Allergies)    Family History  Problem Relation Age of Onset   Stroke Mother    Heart attack Father    Colon cancer Paternal Uncle  Social History   Socioeconomic History   Marital status: Single    Spouse name: Not on file   Number of children: Not on file   Years of education: Not on file   Highest education level: Not on file  Occupational History   Not on file  Tobacco Use   Smoking status: Former    Current packs/day: 0.00    Types: Cigarettes    Quit date: 02/25/1980    Years since quitting: 43.5   Smokeless tobacco: Former    Types: Snuff, Chew  Vaping Use   Vaping status: Never Used  Substance and Sexual Activity   Alcohol use: No   Drug use: No   Sexual activity: Not Currently  Other Topics Concern   Not on file  Social History Narrative   ** Merged History Encounter **       Social Drivers of Corporate investment banker Strain: Not on file  Food Insecurity: Not on file  Transportation Needs: Not on file  Physical Activity: Not on file  Stress: Not on file  Social Connections: Not on file  Intimate Partner Violence: Not on file    Review of Systems: Gen: Denies any fever, chills, cold or flu like symptoms,  presyncope, syncope. CV: Denies chest pain, heart palpitations. Resp: Denies shortness of breath, cough. GI: See HPI GU : Denies urinary burning, urinary frequency, urinary hesitancy MS: Denies joint pain. Derm: Denies rash. Psych: Denies depression, anxiety. Heme: See HPI  Physical Exam: BP 138/80 (BP Location: Right Arm, Patient Position: Sitting, Cuff Size: Normal)   Pulse 83   Temp 99 F (37.2 C) (Temporal)   Ht 5' 8 (1.727 m)   Wt 171 lb (77.6 kg)   BMI 26.00 kg/m  General:   Alert and oriented. Pleasant and cooperative. Well-nourished and well-developed.  Head:  Normocephalic and atraumatic. Eyes:  Without icterus, sclera clear and conjunctiva pink.  Ears:  Normal auditory acuity. Lungs:  Clear to auscultation bilaterally. No wheezes, rales, or rhonchi. No distress.  Heart:  S1, S2 present without murmurs appreciated.  Abdomen:  +BS, soft, non-tender and non-distended. No HSM noted. No guarding or rebound. No masses appreciated.  Rectal:  Deferred  Msk:  Symmetrical without gross deformities. Normal posture. Extremities:  Without edema. Neurologic:  Alert and  oriented x4;  grossly normal neurologically. Skin:  Intact without significant lesions or rashes. Psych:  Normal mood and affect.    Assessment:  78 year old male with history of CAD s/p PCI, CKD, HTN, HLD, hypothyroidism, diet-controlled diabetes, presenting today at the quest of Dr. Marvine for positive Cologuard.  Also note anemia on labs included in referral and patient reporting dysphagia and GERD.  Positive Cologuard: Per patient's report, Cologuard was positive in June 2025.  Last colonoscopy many years ago with normal exam per his report.  Denies BRBPR, melena.  He has had unintentional weight loss as per below.  Also notes some mild change in stool frequency, but this is intermittent and no diarrhea.  Family history significant for 2 paternal uncles with colon cancer.  Needs colonoscopy to further evaluate  positive Cologuard.  Dysphagia: 1 year history of solid food dysphagia that has been progressive.  Also having trouble with liquids intermittently.  Associated regurgitation and weight loss likely due to decreased p.o. intake secondary to dysphagia.  Differentials include esophageal web, ring, stricture in the setting of uncontrolled GERD as per below, and unable to rule out malignancy.  GERD: Chronic.  Uncontrolled.  Using  Tums as needed and recently tried over-the-counter Prilosec with no improvement.  Will start Protonix 40 mg daily.  Unintentional weight loss: Likely secondary to decreased p.o. intake in the setting of dysphagia which is led to decreased p.o. intake.  However, also with recent positive Cologuard, so unable to rule out colonic malignancy.  Needs EGD and colonoscopy for further evaluation.  Anemia: Labs included in referral dated 06/23/2023 with hemoglobin 9.2 with normocytic indices.  Patient and power of attorney report no known history of anemia.  Last labs in our system from 2019 with hemoglobin 12.7.  It is possible this is related to his chronic kidney disease as his creatinine was 2.09.  Denies overt GI bleeding.  I recommended checking iron panel, B12, and folate.  He will also have EGD and colonoscopy regardless due to dysphagia and positive Cologuard as per above.   Plan:  B12, folate, iron panel Proceed with upper endoscopy +/- dilation + colonoscopy with propofol  by Dr. Shaaron in near future. The risks, benefits, and alternatives have been discussed with the patient in detail. The patient states understanding and desires to proceed.  ASA 3 Dysphagia precautions:  Eat slowly, take small bites, chew thoroughly, drink plenty of liquids throughout meals.  Avoid trough textures I recommend that you avoid meats for now unless they are ground up very finely and moistened.  If something gets hung in your esophagus and will not come up or go down, proceed to the emergency  room.   Continue Ensure daily Start pantoprazole 40 mg daily, 30 minutes before breakfast. Follow-up after procedures.   Josette Centers, PA-C [facility] Gastroenterology 08/27/2023

## 2023-08-27 ENCOUNTER — Encounter: Payer: Self-pay | Admitting: Gastroenterology

## 2023-08-27 ENCOUNTER — Ambulatory Visit (INDEPENDENT_AMBULATORY_CARE_PROVIDER_SITE_OTHER): Admitting: Gastroenterology

## 2023-08-27 VITALS — BP 138/80 | HR 83 | Temp 99.0°F | Ht 68.0 in | Wt 171.0 lb

## 2023-08-27 DIAGNOSIS — R195 Other fecal abnormalities: Secondary | ICD-10-CM | POA: Diagnosis not present

## 2023-08-27 DIAGNOSIS — D649 Anemia, unspecified: Secondary | ICD-10-CM

## 2023-08-27 DIAGNOSIS — R634 Abnormal weight loss: Secondary | ICD-10-CM | POA: Diagnosis not present

## 2023-08-27 DIAGNOSIS — K219 Gastro-esophageal reflux disease without esophagitis: Secondary | ICD-10-CM | POA: Diagnosis not present

## 2023-08-27 DIAGNOSIS — R1310 Dysphagia, unspecified: Secondary | ICD-10-CM

## 2023-08-27 MED ORDER — PANTOPRAZOLE SODIUM 40 MG PO TBEC: 40.0000 mg | DELAYED_RELEASE_TABLET | Freq: Every day | ORAL | 3 refills | Status: DC

## 2023-08-27 NOTE — Patient Instructions (Addendum)
 We will get you scheduled for an upper endoscopy with possible dilation of your esophagus and colonoscopy with Dr. Shaaron at [facility].  Swallowing precautions:  Eat slowly, take small bites, chew thoroughly, drink plenty of liquids throughout meals.  Avoid trough textures I recommend that you avoid meats for now unless they are ground up very finely and moistened.  If something gets hung in your esophagus and will not come up or go down, proceed to the emergency room.    Start pantoprazole 40 mg daily, 30 minutes before breakfast for acid reflux.   Josette Centers, PA-C [facility]

## 2023-08-28 LAB — B12 AND FOLATE PANEL
Folate: 11.6 ng/mL (ref >3.0)
Vitamin B-12: 237 pg/mL (ref 232–1245)

## 2023-08-28 LAB — IRON,TIBC AND FERRITIN PANEL
Ferritin: 60 ng/mL (ref 30–400)
Iron Saturation: 29 % (ref 15–55)
Iron: 72 ug/dL (ref 38–169)
Total Iron Binding Capacity: 246 ug/dL — ABNORMAL LOW (ref 250–450)
UIBC: 174 ug/dL (ref 111–343)

## 2023-08-30 ENCOUNTER — Ambulatory Visit: Payer: Self-pay | Admitting: Gastroenterology

## 2023-09-07 ENCOUNTER — Encounter: Payer: Self-pay | Admitting: *Deleted

## 2023-09-07 ENCOUNTER — Other Ambulatory Visit: Payer: Self-pay | Admitting: *Deleted

## 2023-09-07 MED ORDER — PEG 3350-KCL-NA BICARB-NACL 420 G PO SOLR: 4000.0000 mL | Freq: Once | ORAL | 0 refills | Status: AC

## 2023-09-22 NOTE — Patient Instructions (Signed)
 [name]  09/22/2023     @PREFPERIOPPHARMACY @   Your procedure is scheduled on  09/30/2023.   Report to [facility] at  0600 A.M.   Call this number if you have problems the morning of surgery:  [phone]  If you experience any cold or flu symptoms such as cough, fever, chills, shortness of breath, etc. between now and your scheduled surgery, please notify us  at the above number.   Remember:         DO NOT take any medications for diabetes the morning of your procedure.   Follow the diet and prep instructions given to you by the office.   You may drink clear liquids until  0330 am on 09/30/2023.    Clear liquids allowed are:                    Water , Juice (No red color; non-citric and without pulp; diabetics please choose diet or no sugar options), Carbonated beverages (diabetics please choose diet or no sugar options), Clear Tea (No creamer, milk, or cream, including half & half and powdered creamer), Black Coffee Only (No creamer, milk or cream, including half & half and powdered creamer), and Clear Sports drink (No red color; diabetics please choose diet or no sugar options)    Take these medicines the morning of surgery with A SIP OF WATER         carvedilol , diazepam  (if needed), escitalopram , levothyroxine , pantoprazole .    Do not wear jewelry, make-up or nail polish, including gel polish,  artificial nails, or any other type of covering on natural nails (fingers and  toes).  Do not wear lotions, powders, or perfumes, or deodorant.  Do not shave 48 hours prior to surgery.  Men may shave face and neck.  Do not bring valuables to the hospital.  [facility] is not responsible for any belongings or valuables.  Contacts, dentures or bridgework may not be worn into surgery.  Leave your suitcase in the car.  After surgery it may be brought to your room.  For patients admitted to the hospital, discharge time will be determined by your treatment  team.  Patients discharged the day of surgery will not be allowed to drive home and must have someone with them for 24 hours.    Special instructions:   DO NOT smoke tobacco or vape for 24 hours before your procedure.  Please read over the following fact sheets that you were given. Anesthesia Post-op Instructions and Care and Recovery After Surgery      Upper Endoscopy, Adult, Care After After the procedure, it is common to have a sore throat. It is also common to have: Mild stomach pain or discomfort. Bloating. Nausea. Follow these instructions at home: The instructions below may help you care for yourself at home. Your health care provider may give you more instructions. If you have questions, ask your health care provider. If you were given a sedative during the procedure, it can affect you for several hours. Do not drive or operate machinery until your health care provider says that it is safe. If you will be going home right after the procedure, plan to have a responsible adult: Take you home from the hospital or clinic. You will not be allowed to drive. Care for you for the time you are told. Follow instructions from your health care provider about what you may eat and drink. Return to  your normal activities as told by your health care provider. Ask your health care provider what activities are safe for you. Take over-the-counter and prescription medicines only as told by your health care provider. Contact a health care provider if you: Have a sore throat that lasts longer than one day. Have trouble swallowing. Have a fever. Get help right away if you: Vomit blood or your vomit looks like coffee grounds. Have bloody, black, or tarry stools. Have a very bad sore throat or you cannot swallow. Have difficulty breathing or very bad pain in your chest or abdomen. These symptoms may be an emergency. Get help right away. Call 911. Do not wait to see if the symptoms will go  away. Do not drive yourself to the hospital. Summary After the procedure, it is common to have a sore throat, mild stomach discomfort, bloating, and nausea. If you were given a sedative during the procedure, it can affect you for several hours. Do not drive until your health care provider says that it is safe. Follow instructions from your health care provider about what you may eat and drink. Return to your normal activities as told by your health care provider. This information is not intended to replace advice given to you by your health care provider. Make sure you discuss any questions you have with your health care provider. Document Revised: 05/22/2021 Document Reviewed: 05/22/2021 Elsevier Patient Education  2024 [employer]Esophageal Dilatation Esophageal dilatation, or dilation, is done to stretch a blocked or narrowed part of your esophagus. The esophagus is the part of your body that moves food from your mouth to your stomach. You may need to have it stretched if: You have a lot of scar tissue and it makes it hard or painful to swallow. You have cancer of the esophagus. There's a problem with how food moves through your esophagus. In some cases, you may need to have this procedure done more than once. Tell a health care provider about: Any allergies you have. All medicines you're taking, including vitamins, herbs, eye drops, creams, and over-the-counter medicines. Any problems you or family members have had with anesthesia. Any bleeding problems you have. Any surgeries you've had. Any medical conditions you have. Whether you're pregnant or may be pregnant. What are the risks? Your health care provider will talk with you about risks. These may include: Bleeding. A hole or tear in your esophagus. What happens before the procedure? When to stop eating and drinking Follow instructions from your provider about what you may eat and drink. These may include: 8 hours before your  procedure Stop eating most foods. Do not eat meat, fried foods, or fatty foods. Eat only light foods, such as toast or crackers. All liquids are okay except energy drinks and alcohol. 6 hours before your procedure Stop eating. Drink only clear liquids, such as water , clear fruit juice, black coffee, plain tea, and sports drinks. Do not drink energy drinks or alcohol. 2 hours before your procedure Stop drinking all liquids. You may be allowed to take medicines with small sips of water . If you don't follow your provider's instructions, your procedure may be delayed or canceled. Medicines Ask your provider about: Changing or stopping your regular medicines. These include any diabetes medicines or blood thinners you take. Taking medicines such as aspirin  and ibuprofen . These medicines can thin your blood. Do not take them unless your provider tells you to. Taking over-the-counter medicines, vitamins, herbs, and supplements. General instructions If you'll be going home  right after the procedure, plan to have a responsible adult: Take you home from the hospital or clinic. You won't be allowed to drive. Care for you for the time you're told. What happens during the procedure? You may be given: A sedative. This helps you relax. Anesthesia. This keeps you from feeling pain. It will numb certain areas of your body. The stretching may be done with: Simple dilators. These are tools put in your esophagus to stretch it. Guide wires. These wires are put in using a tube called an endoscope. A dilator is put over the wires to stretch your esophagus. Then the wires are taken out. A balloon. The balloon is on the end of a tube. It's inflated to stretch your esophagus. The procedure may vary among providers and hospitals. What can I expect after the procedure? Your blood pressure, heart rate, breathing rate, and blood oxygen level will be monitored until you leave the hospital or clinic. Your throat may  feel sore and numb. This will get better over time. You won't be allowed to eat or drink until your throat is no longer numb. You may be able to go home when you can: Drink. Pee. Sit on the edge of the bed without nausea or dizziness. Follow these instructions at home: Activity If you were given a sedative during the procedure, it can affect you for several hours. Do not drive or operate machinery until your provider says it's safe. Return to your normal activities as told by your provider. Ask your provider what activities are safe for you. General instructions Take over-the-counter and prescription medicines only as told by your provider. Follow instructions from your provider about what you may eat and drink. Do not use any products that contain nicotine or tobacco. These products include cigarettes, chewing tobacco, and vaping devices, such as e-cigarettes. If you need help quitting, ask your provider. Keep all follow-up visits. Your provider will make sure the procedure worked. Where to find more information American Society for Gastrointestinal Endoscopy (ASGE): asge.org Contact a health care provider if: You have trouble swallowing. You have a fever. Your pain doesn't get better with medicine. Get help right away if: You have chest pain. You have trouble breathing. You vomit blood. Your poop is: Black. Tarry. Bloody. These symptoms may be an emergency. Get help right away. Call 911. Do not wait to see if the symptoms will go away. Do not drive yourself to the hospital. This information is not intended to replace advice given to you by your health care provider. Make sure you discuss any questions you have with your health care provider. Document Revised: 05/09/2022 Document Reviewed: 05/09/2022 Elsevier Patient Education  2024 Else[employer].Colonoscopy, Adult, Care After The following information offers guidance on how to care for yourself after your procedure. Your health  care provider may also give you more specific instructions. If you have problems or questions, contact your health care provider. What can I expect after the procedure? After the procedure, it is common to have: A small amount of blood in your stool for 24 hours after the procedure. Some gas. Mild cramping or bloating of your abdomen. Follow these instructions at home: Eating and drinking  Drink enough fluid to keep your urine pale yellow. Follow instructions from your health care provider about eating or drinking restrictions. Resume your normal diet as told by your health care provider. Avoid heavy or fried foods that are hard to digest. Activity Rest as told by your health care provider.  Avoid sitting for a long time without moving. Get up to take short walks every 1-2 hours. This is important to improve blood flow and breathing. Ask for help if you feel weak or unsteady. Return to your normal activities as told by your health care provider. Ask your health care provider what activities are safe for you. Managing cramping and bloating  Try walking around when you have cramps or feel bloated. If directed, apply heat to your abdomen as told by your health care provider. Use the heat source that your health care provider recommends, such as a moist heat pack or a heating pad. Place a towel between your skin and the heat source. Leave the heat on for 20-30 minutes. Remove the heat if your skin turns bright red. This is especially important if you are unable to feel pain, heat, or cold. You have a greater risk of getting burned. General instructions If you were given a sedative during the procedure, it can affect you for several hours. Do not drive or operate machinery until your health care provider says that it is safe. For the first 24 hours after the procedure: Do not sign important documents. Do not drink alcohol. Do your regular daily activities at a slower pace than normal. Eat soft  foods that are easy to digest. Take over-the-counter and prescription medicines only as told by your health care provider. Keep all follow-up visits. This is important. Contact a health care provider if: You have blood in your stool 2-3 days after the procedure. Get help right away if: You have more than a small spotting of blood in your stool. You have large blood clots in your stool. You have swelling of your abdomen. You have nausea or vomiting. You have a fever. You have increasing pain in your abdomen that is not relieved with medicine. These symptoms may be an emergency. Get help right away. Call 911. Do not wait to see if the symptoms will go away. Do not drive yourself to the hospital. Summary After the procedure, it is common to have a small amount of blood in your stool. You may also have mild cramping and bloating of your abdomen. If you were given a sedative during the procedure, it can affect you for several hours. Do not drive or operate machinery until your health care provider says that it is safe. Get help right away if you have a lot of blood in your stool, nausea or vomiting, a fever, or increased pain in your abdomen. This information is not intended to replace advice given to you by your health care provider. Make sure you discuss any questions you have with your health care provider. Document Revised: 03/25/2022 Document Reviewed: 10/03/2020 Elsevier Patient Education  2024 Elsevier Inc.General Anesthesia, Adult, Care After The following information offers guidance on how to care for yourself after your procedure. Your health care provider may also give you more specific instructions. If you have problems or questions, contact your health care provider. What can I expect after the procedure? After the procedure, it is common for people to: Have pain or discomfort at the IV site. Have nausea or vomiting. Have a sore throat or hoarseness. Have trouble  concentrating. Feel cold or chills. Feel weak, sleepy, or tired (fatigue). Have soreness and body aches. These can affect parts of the body that were not involved in surgery. Follow these instructions at home: For the time period you were told by your health care provider:  Rest. Do not  participate in activities where you could fall or become injured. Do not drive or use machinery. Do not drink alcohol. Do not take sleeping pills or medicines that cause drowsiness. Do not make important decisions or sign legal documents. Do not take care of children on your own. General instructions Drink enough fluid to keep your urine pale yellow. If you have sleep apnea, surgery and certain medicines can increase your risk for breathing problems. Follow instructions from your health care provider about wearing your sleep device: Anytime you are sleeping, including during daytime naps. While taking prescription pain medicines, sleeping medicines, or medicines that make you drowsy. Return to your normal activities as told by your health care provider. Ask your health care provider what activities are safe for you. Take over-the-counter and prescription medicines only as told by your health care provider. Do not use any products that contain nicotine or tobacco. These products include cigarettes, chewing tobacco, and vaping devices, such as e-cigarettes. These can delay incision healing after surgery. If you need help quitting, ask your health care provider. Contact a health care provider if: You have nausea or vomiting that does not get better with medicine. You vomit every time you eat or drink. You have pain that does not get better with medicine. You cannot urinate or have bloody urine. You develop a skin rash. You have a fever. Get help right away if: You have trouble breathing. You have chest pain. You vomit blood. These symptoms may be an emergency. Get help right away. Call 911. Do not wait  to see if the symptoms will go away. Do not drive yourself to the hospital. Summary After the procedure, it is common to have a sore throat, hoarseness, nausea, vomiting, or to feel weak, sleepy, or fatigue. For the time period you were told by your health care provider, do not drive or use machinery. Get help right away if you have difficulty breathing, have chest pain, or vomit blood. These symptoms may be an emergency. This information is not intended to replace advice given to you by your health care provider. Make sure you discuss any questions you have with your health care provider. Document Revised: 05/10/2021 Document Reviewed: 05/10/2021 Elsevier Patient Education  2024 [employer].

## 2023-09-24 ENCOUNTER — Encounter (HOSPITAL_COMMUNITY)

## 2023-09-24 ENCOUNTER — Encounter (HOSPITAL_COMMUNITY)
Admission: RE | Admit: 2023-09-24 | Discharge: 2023-09-24 | Disposition: A | Discharge status: Home / Self Care | Source: Ambulatory Visit | Attending: Internal Medicine | Admitting: Internal Medicine

## 2023-09-24 VITALS — BP 122/65 | HR 83 | Resp 18 | Ht 68.0 in | Wt 171.1 lb

## 2023-09-24 DIAGNOSIS — D649 Anemia, unspecified: Secondary | ICD-10-CM | POA: Diagnosis present

## 2023-09-24 DIAGNOSIS — Z01818 Encounter for other preprocedural examination: Secondary | ICD-10-CM | POA: Insufficient documentation

## 2023-09-24 DIAGNOSIS — N189 Chronic kidney disease, unspecified: Secondary | ICD-10-CM | POA: Insufficient documentation

## 2023-09-24 DIAGNOSIS — D631 Anemia in chronic kidney disease: Secondary | ICD-10-CM | POA: Diagnosis not present

## 2023-09-24 DIAGNOSIS — R9431 Abnormal electrocardiogram [ECG] [EKG]: Secondary | ICD-10-CM | POA: Insufficient documentation

## 2023-09-24 DIAGNOSIS — E1122 Type 2 diabetes mellitus with diabetic chronic kidney disease: Secondary | ICD-10-CM | POA: Diagnosis not present

## 2023-09-24 DIAGNOSIS — Z01810 Encounter for preprocedural cardiovascular examination: Secondary | ICD-10-CM | POA: Diagnosis present

## 2023-09-24 DIAGNOSIS — E119 Type 2 diabetes mellitus without complications: Secondary | ICD-10-CM

## 2023-09-24 DIAGNOSIS — Z01812 Encounter for preprocedural laboratory examination: Secondary | ICD-10-CM | POA: Diagnosis present

## 2023-09-24 LAB — BASIC METABOLIC PANEL WITH GFR
Anion gap: 12 (ref 5–15)
BUN: 64 mg/dL — ABNORMAL HIGH (ref 8–23)
CO2: 17 mmol/L — ABNORMAL LOW (ref 22–32)
Calcium: 8.9 mg/dL (ref 8.9–10.3)
Chloride: 110 mmol/L (ref 98–111)
Creatinine, Ser: 3.79 mg/dL — ABNORMAL HIGH (ref 0.61–1.24)
GFR, Estimated: 15 mL/min — ABNORMAL LOW (ref >60)
Glucose, Bld: 119 mg/dL — ABNORMAL HIGH (ref 70–99)
Potassium: 4.6 mmol/L (ref 3.5–5.1)
Sodium: 139 mmol/L (ref 135–145)

## 2023-09-24 LAB — CBC WITH DIFFERENTIAL/PLATELET
Abs Immature Granulocytes: 0.05 K/uL (ref 0.00–0.07)
Basophils Absolute: 0 K/uL (ref 0.0–0.1)
Basophils Relative: 0 %
Eosinophils Absolute: 0.2 K/uL (ref 0.0–0.5)
Eosinophils Relative: 1 %
HCT: 29.8 % — ABNORMAL LOW (ref 39.0–52.0)
Hemoglobin: 9.3 g/dL — ABNORMAL LOW (ref 13.0–17.0)
Immature Granulocytes: 1 %
Lymphocytes Relative: 9 %
Lymphs Abs: 1 K/uL (ref 0.7–4.0)
MCH: 30.8 pg (ref 26.0–34.0)
MCHC: 31.2 g/dL (ref 30.0–36.0)
MCV: 98.7 fL (ref 80.0–100.0)
Monocytes Absolute: 0.4 K/uL (ref 0.1–1.0)
Monocytes Relative: 3 %
Neutro Abs: 9.1 K/uL — ABNORMAL HIGH (ref 1.7–7.7)
Neutrophils Relative %: 86 %
Platelets: 464 K/uL — ABNORMAL HIGH (ref 150–400)
RBC: 3.02 MIL/uL — ABNORMAL LOW (ref 4.22–5.81)
RDW: 17.2 % — ABNORMAL HIGH (ref 11.5–15.5)
WBC: 10.7 K/uL — ABNORMAL HIGH (ref 4.0–10.5)
nRBC: 0 % (ref 0.0–0.2)

## 2023-09-25 ENCOUNTER — Ambulatory Visit: Payer: Self-pay | Admitting: Internal Medicine

## 2023-09-28 NOTE — Progress Notes (Signed)
 Done

## 2023-09-30 ENCOUNTER — Other Ambulatory Visit: Payer: Self-pay

## 2023-09-30 ENCOUNTER — Encounter (HOSPITAL_COMMUNITY): Payer: Self-pay | Admitting: Internal Medicine

## 2023-09-30 ENCOUNTER — Ambulatory Visit (HOSPITAL_COMMUNITY)

## 2023-09-30 ENCOUNTER — Ambulatory Visit (HOSPITAL_COMMUNITY)
Admission: RE | Admit: 2023-09-30 | Discharge: 2023-09-30 | Disposition: A | Discharge status: Home / Self Care | Attending: Internal Medicine | Admitting: Internal Medicine

## 2023-09-30 ENCOUNTER — Encounter (HOSPITAL_COMMUNITY)
Admission: RE | Disposition: A | Discharge status: Home / Self Care | Payer: Self-pay | Source: Home / Self Care | Attending: Internal Medicine

## 2023-09-30 DIAGNOSIS — D123 Benign neoplasm of transverse colon: Secondary | ICD-10-CM | POA: Insufficient documentation

## 2023-09-30 DIAGNOSIS — E039 Hypothyroidism, unspecified: Secondary | ICD-10-CM | POA: Insufficient documentation

## 2023-09-30 DIAGNOSIS — I2510 Atherosclerotic heart disease of native coronary artery without angina pectoris: Secondary | ICD-10-CM | POA: Insufficient documentation

## 2023-09-30 DIAGNOSIS — Z87891 Personal history of nicotine dependence: Secondary | ICD-10-CM | POA: Diagnosis not present

## 2023-09-30 DIAGNOSIS — E1122 Type 2 diabetes mellitus with diabetic chronic kidney disease: Secondary | ICD-10-CM | POA: Insufficient documentation

## 2023-09-30 DIAGNOSIS — K644 Residual hemorrhoidal skin tags: Secondary | ICD-10-CM | POA: Diagnosis not present

## 2023-09-30 DIAGNOSIS — D125 Benign neoplasm of sigmoid colon: Secondary | ICD-10-CM | POA: Diagnosis not present

## 2023-09-30 DIAGNOSIS — K5730 Diverticulosis of large intestine without perforation or abscess without bleeding: Secondary | ICD-10-CM

## 2023-09-30 DIAGNOSIS — R12 Heartburn: Secondary | ICD-10-CM | POA: Diagnosis not present

## 2023-09-30 DIAGNOSIS — I25119 Atherosclerotic heart disease of native coronary artery with unspecified angina pectoris: Secondary | ICD-10-CM | POA: Diagnosis not present

## 2023-09-30 DIAGNOSIS — Z1211 Encounter for screening for malignant neoplasm of colon: Secondary | ICD-10-CM

## 2023-09-30 DIAGNOSIS — I129 Hypertensive chronic kidney disease with stage 1 through stage 4 chronic kidney disease, or unspecified chronic kidney disease: Secondary | ICD-10-CM | POA: Diagnosis not present

## 2023-09-30 DIAGNOSIS — D124 Benign neoplasm of descending colon: Secondary | ICD-10-CM | POA: Diagnosis not present

## 2023-09-30 DIAGNOSIS — K219 Gastro-esophageal reflux disease without esophagitis: Secondary | ICD-10-CM | POA: Diagnosis not present

## 2023-09-30 DIAGNOSIS — R1310 Dysphagia, unspecified: Secondary | ICD-10-CM | POA: Diagnosis not present

## 2023-09-30 DIAGNOSIS — D120 Benign neoplasm of cecum: Secondary | ICD-10-CM | POA: Diagnosis not present

## 2023-09-30 DIAGNOSIS — D122 Benign neoplasm of ascending colon: Secondary | ICD-10-CM | POA: Diagnosis not present

## 2023-09-30 DIAGNOSIS — E785 Hyperlipidemia, unspecified: Secondary | ICD-10-CM | POA: Insufficient documentation

## 2023-09-30 DIAGNOSIS — K222 Esophageal obstruction: Secondary | ICD-10-CM | POA: Insufficient documentation

## 2023-09-30 DIAGNOSIS — D649 Anemia, unspecified: Secondary | ICD-10-CM

## 2023-09-30 DIAGNOSIS — K449 Diaphragmatic hernia without obstruction or gangrene: Secondary | ICD-10-CM | POA: Diagnosis not present

## 2023-09-30 DIAGNOSIS — E119 Type 2 diabetes mellitus without complications: Secondary | ICD-10-CM | POA: Diagnosis not present

## 2023-09-30 DIAGNOSIS — K635 Polyp of colon: Secondary | ICD-10-CM | POA: Diagnosis not present

## 2023-09-30 LAB — GLUCOSE, CAPILLARY: Glucose-Capillary: 79 mg/dL (ref 70–99)

## 2023-09-30 SURGERY — COLONOSCOPY
Anesthesia: General

## 2023-09-30 MED ORDER — LIDOCAINE 2% (20 MG/ML) 5 ML SYRINGE
INTRAMUSCULAR | Status: DC | PRN
  Administered 2023-09-30: 60 mg via INTRAVENOUS

## 2023-09-30 MED ORDER — GLUCAGON HCL RDNA (DIAGNOSTIC) 1 MG IJ SOLR
INTRAMUSCULAR | Status: DC | PRN
  Administered 2023-09-30: 1 mg via INTRAVENOUS

## 2023-09-30 MED ORDER — STERILE WATER FOR IRRIGATION IR SOLN
Status: DC | PRN
  Administered 2023-09-30: 60 mL

## 2023-09-30 MED ORDER — LACTATED RINGERS IV SOLN: INTRAVENOUS | Status: DC | PRN

## 2023-09-30 MED ORDER — LACTATED RINGERS IV SOLN: INTRAVENOUS | Status: DC

## 2023-09-30 MED ORDER — EPHEDRINE SULFATE-NACL 50-0.9 MG/10ML-% IV SOSY
PREFILLED_SYRINGE | INTRAVENOUS | Status: DC | PRN
  Administered 2023-09-30 (×5): 5 mg via INTRAVENOUS

## 2023-09-30 MED ORDER — PROPOFOL 500 MG/50ML IV EMUL
INTRAVENOUS | Status: DC | PRN
Start: 2023-09-30 — End: 2023-09-30
  Administered 2023-09-30: 100 ug/kg/min via INTRAVENOUS
  Administered 2023-09-30: 50 mg via INTRAVENOUS
  Administered 2023-09-30: 30 mg via INTRAVENOUS
  Administered 2023-09-30: 80 ug/kg/min via INTRAVENOUS
  Administered 2023-09-30: 50 mg via INTRAVENOUS
  Administered 2023-09-30: 30 mg via INTRAVENOUS
  Administered 2023-09-30: 100 mg via INTRAVENOUS

## 2023-09-30 MED ORDER — PHENYLEPHRINE 80 MCG/ML (10ML) SYRINGE FOR IV PUSH (FOR BLOOD PRESSURE SUPPORT)
PREFILLED_SYRINGE | INTRAVENOUS | Status: DC | PRN
  Administered 2023-09-30 (×17): 80 ug via INTRAVENOUS

## 2023-09-30 NOTE — Anesthesia Postprocedure Evaluation (Signed)
 Anesthesia Post Note  Patient: [name]  Procedure(s) Performed: COLONOSCOPY EGD (ESOPHAGOGASTRODUODENOSCOPY) DILATION, ESOPHAGUS  Patient location during evaluation: PACU Anesthesia Type: General Level of consciousness: awake and alert Pain management: pain level controlled Vital Signs Assessment: post-procedure vital signs reviewed and stable Respiratory status: spontaneous breathing, nonlabored ventilation, respiratory function stable and patient connected to nasal cannula oxygen Cardiovascular status: stable and blood pressure returned to baseline Postop Assessment: no apparent nausea or vomiting Anesthetic complications: no   No notable events documented.   Last Vitals:  Vitals:   09/30/23 0905 09/30/23 0915  BP: (!) 90/46 (!) 102/56  Pulse: (!) 38   Resp: 14   Temp: 36.9 C   SpO2: 100%     Last Pain:  Vitals:   09/30/23 0905  TempSrc: Oral  PainSc: 0-No pain                 Andrea Limes

## 2023-09-30 NOTE — Discharge Instructions (Addendum)
 EGD Discharge instructions Please read the instructions outlined below and refer to this sheet in the next few weeks. These discharge instructions provide you with general information on caring for yourself after you leave the hospital. Your doctor may also give you specific instructions. While your treatment has been planned according to the most current medical practices available, unavoidable complications occasionally occur. If you have any problems or questions after discharge, please call your doctor. ACTIVITY You may resume your regular activity but move at a slower pace for the next 24 hours.  Take frequent rest periods for the next 24 hours.  Walking will help expel (get rid of) the air and reduce the bloated feeling in your abdomen.  No driving for 24 hours (because of the anesthesia (medicine) used during the test).  You may shower.  Do not sign any important legal documents or operate any machinery for 24 hours (because of the anesthesia used during the test).  NUTRITION Drink plenty of fluids.  You may resume your normal diet.  Begin with a light meal and progress to your normal diet.  Avoid alcoholic beverages for 24 hours or as instructed by your caregiver.  MEDICATIONS You may resume your normal medications unless your caregiver tells you otherwise.  WHAT YOU CAN EXPECT TODAY You may experience abdominal discomfort such as a feeling of fullness or "gas" pains.  FOLLOW-UP Your doctor will discuss the results of your test with you.  SEEK IMMEDIATE MEDICAL ATTENTION IF ANY OF THE FOLLOWING OCCUR: Excessive nausea (feeling sick to your stomach) and/or vomiting.  Severe abdominal pain and distention (swelling).  Trouble swallowing.  Temperature over 101 F (37.8 C).  Rectal bleeding or vomiting of blood.    Colonoscopy Discharge Instructions  Read the instructions outlined below and refer to this sheet in the next few weeks. These discharge instructions provide you with  general information on caring for yourself after you leave the hospital. Your doctor may also give you specific instructions. While your treatment has been planned according to the most current medical practices available, unavoidable complications occasionally occur. If you have any problems or questions after discharge, call Dr. Shaaron at [phone]. ACTIVITY You may resume your regular activity, but move at a slower pace for the next 24 hours.  Take frequent rest periods for the next 24 hours.  Walking will help get rid of the air and reduce the bloated feeling in your belly (abdomen).  No driving for 24 hours (because of the medicine (anesthesia) used during the test).   Do not sign any important legal documents or operate any machinery for 24 hours (because of the anesthesia used during the test).  NUTRITION Drink plenty of fluids.  You may resume your normal diet as instructed by your doctor.  Begin with a light meal and progress to your normal diet. Heavy or fried foods are harder to digest and may make you feel sick to your stomach (nauseated).  Avoid alcoholic beverages for 24 hours or as instructed.  MEDICATIONS You may resume your normal medications unless your doctor tells you otherwise.  WHAT YOU CAN EXPECT TODAY Some feelings of bloating in the abdomen.  Passage of more gas than usual.  Spotting of blood in your stool or on the toilet paper.  IF YOU HAD POLYPS REMOVED DURING THE COLONOSCOPY: No aspirin  products for 7 days or as instructed.  No alcohol for 7 days or as instructed.  Eat a soft diet for the next 24 hours.  FINDING  OUT THE RESULTS OF YOUR TEST Not all test results are available during your visit. If your test results are not back during the visit, make an appointment with your caregiver to find out the results. Do not assume everything is normal if you have not heard from your caregiver or the medical facility. It is important for you to follow up on all of your test  results.  SEEK IMMEDIATE MEDICAL ATTENTION IF: You have more than a spotting of blood in your stool.  Your belly is swollen (abdominal distention).  You are nauseated or vomiting.  You have a temperature over 101.  You have abdominal pain or discomfort that is severe or gets worse throughout the day.     Your esophagus was dilated today.  Your stomach appears abnormal you may have a complex hernia.  Exam could not be completed.  Stomach was biopsied.  Upper GI series to further evaluate abnormal stomach seen today  continue pantoprazole  or Protonix  40 mg daily.  You had 15 polyps removed from your colon today.  Further recommendations to follow once the biopsy report returns for review.  At patient request, I called Alan Giovanni [phone] findings and recommendations

## 2023-09-30 NOTE — Anesthesia Preprocedure Evaluation (Addendum)
 Anesthesia Evaluation  Patient identified by MRN, date of birth, ID band Patient awake    Reviewed: Allergy & Precautions, H&P , NPO status , Patient's Chart, lab work & pertinent test results  Airway Mallampati: II  TM Distance: >3 FB Neck ROM: Full    Dental no notable dental hx.    Pulmonary former smoker   Pulmonary exam normal breath sounds clear to auscultation       Cardiovascular hypertension, + angina  + CAD  Normal cardiovascular exam Rhythm:Regular Rate:Normal  Stent LAD 2018 nl EF   Neuro/Psych negative neurological ROS  negative psych ROS   GI/Hepatic Neg liver ROS,GERD  ,,  Endo/Other  diabetesHypothyroidism    Renal/GU Renal disease  negative genitourinary   Musculoskeletal negative musculoskeletal ROS (+)    Abdominal   Peds negative pediatric ROS (+)  Hematology  (+) Blood dyscrasia, anemia   Anesthesia Other Findings   Reproductive/Obstetrics negative OB ROS                              Anesthesia Physical Anesthesia Plan  ASA: 3  Anesthesia Plan: General   Post-op Pain Management:    Induction: Intravenous  PONV Risk Score and Plan:   Airway Management Planned:   Additional Equipment:   Intra-op Plan:   Post-operative Plan:   Informed Consent: I have reviewed the patients History and Physical, chart, labs and discussed the procedure including the risks, benefits and alternatives for the proposed anesthesia with the patient or authorized representative who has indicated his/her understanding and acceptance.     Dental advisory given  Plan Discussed with: CRNA  Anesthesia Plan Comments:         Anesthesia Quick Evaluation

## 2023-09-30 NOTE — Anesthesia Procedure Notes (Signed)
 Date/Time: 09/30/2023 7:32 AM  Performed by: Barbarann Verneita RAMAN, CRNAPre-anesthesia Checklist: Patient identified, Emergency Drugs available, Suction available, Timeout performed and Patient being monitored Patient Re-evaluated:Patient Re-evaluated prior to induction Oxygen Delivery Method: Nasal Cannula

## 2023-09-30 NOTE — Transfer of Care (Signed)
 Immediate Anesthesia Transfer of Care Note  Patient: [name]  Procedure(s) Performed: COLONOSCOPY EGD (ESOPHAGOGASTRODUODENOSCOPY) DILATION, ESOPHAGUS  Patient Location: Short Stay  Anesthesia Type:General  Level of Consciousness: awake and patient cooperative  Airway & Oxygen Therapy: Patient Spontanous Breathing  Post-op Assessment: Report given to RN and Post -op Vital signs reviewed and stable  Post vital signs: Reviewed and stable  Last Vitals:  Vitals Value Taken Time  BP 90/46 09/30/23 09:05  Temp 36.9 C 09/30/23 09:05  Pulse 38 09/30/23 09:05  Resp 14 09/30/23 09:05  SpO2 100 % 09/30/23 09:05    Last Pain:  Vitals:   09/30/23 0905  TempSrc: Oral  PainSc: 0-No pain      Patients Stated Pain Goal: 4 (09/30/23 9367)  Complications: No notable events documented.

## 2023-09-30 NOTE — Op Note (Signed)
 [facility] Patient Name: [name] Procedure Date: 09/30/2023 7:13 AM MRN: [id number] Date of Birth: Apr 21, 1945 Attending MD: Lamar Ozell Hollingshead , MD, [id number] CSN: [id number] Age: 78 Admit Type: Outpatient Procedure:                Upper GI endoscopy (incomplete) Indications:              Dysphagia, Heartburn Providers:                Lamar Ozell Hollingshead, MD, Leandrew Edelman RN, RN, [place]                            Wilson, Technician Referring MD:              Medicines:                Propofol  per Anesthesia Complications:            No immediate complications. Estimated Blood Loss:     Estimated blood loss was minimal. Procedure:                Pre-Anesthesia Assessment:                           - Prior to the procedure, a History and Physical                            was performed, and patient medications and                            allergies were reviewed. The patient's tolerance of                            previous anesthesia was also reviewed. The risks                            and benefits of the procedure and the sedation                            options and risks were discussed with the patient.                            All questions were answered, and informed consent                            was obtained. Prior Anticoagulants: The patient                            last took Plavix  (clopidogrel ) 1 day prior to the                            procedure. ASA Grade Assessment: III - A patient                            with severe systemic disease. After reviewing the  risks and benefits, the patient was deemed in                            satisfactory condition to undergo the procedure.                           After obtaining informed consent, the endoscope was                            passed under direct vision. Throughout the                            procedure, the patient's blood pressure, pulse, and                             oxygen saturations were monitored continuously. The                            [id number]-H190 ([id number]) scope was introduced through the                            mouth, and advanced to the body of the stomach. Scope In: 7:39:20 AM Scope Out: 7:59:38 AM Total Procedure Duration: 0 hours 20 minutes 18 seconds  Findings:      A moderate Schatzki ring was found at the gastroesophageal junction.       Otherwise, tubular esophagus appeared normal.      Anatomically altered. Gastric cavity. Patient appeared to have a large       hiatal hernia with the diaphragmatic hiatus noted about 8 cm distal to       the GE junction - no outlet seen I retroflexed the gastric pouch and       noted herniation up beside the GE junction. With herniation up beside       the EG junction. Please see photos. I advanced scope through opening;       the stomach took an acute turn; I was unable to identify the       lumen/pylorus to complete the examination. Gastric mucosa had a       scarlatiniform appearance diffusely. The scope was withdrawn. Maloney       dilator passed to full insertion with mild resistance. The dilation site       was examined following endoscope reinsertion and showed complete       resolution of luminal narrowing. Estimated blood loss was minimal.       Finally, biopsies of the abnormal gastric were taken for histologic       study. Impression:               - Moderate Schatzki ring. Status post dilation.                            Abnormal stomach. Query paraesophageal hernia /                            partial gastric torsion. -Abnormal appearing  gastric mucosa. Status post biopsy. . Moderate Sedation:      Moderate (conscious) sedation was personally administered by an       anesthesia professional. The following parameters were monitored: oxygen       saturation, heart rate, blood pressure, respiratory rate, EKG, adequacy       of pulmonary  ventilation, and response to care. Recommendation:           - Patient has a contact number available for                            emergencies. The signs and symptoms of potential                            delayed complications were discussed with the                            patient. Return to normal activities tomorrow.                            Written discharge instructions were provided to the                            patient.                           - Return to my office (date not yet determined).                            Begin pantoprazole  40 mg once daily. Follow-up on                            pathology. Upper GI series to better define his                            anatomy. See colonoscopy report. Procedure Code(s):        --- Professional ---                           [phone], 52, Esophagogastroduodenoscopy, flexible,                            transoral; diagnostic, including collection of                            specimen(s) by brushing or washing, when performed                            (separate procedure)                           [id number], 52, Dilation of esophagus, by unguided sound                            or bougie, single or multiple passes Diagnosis Code(s):        --- Professional ---  K22.2, Esophageal obstruction                           R13.10, Dysphagia, unspecified                           R12, Heartburn CPT copyright 2022 American Medical Association. All rights reserved. The codes documented in this report are preliminary and upon coder review may  be revised to meet current compliance requirements. Lamar HERO. Chrysta Fulcher, MD Lamar Ozell Hollingshead, MD 09/30/2023 9:11:17 AM This report has been signed electronically. Number of Addenda: 0

## 2023-09-30 NOTE — Op Note (Signed)
 [facility] Patient Name: [name] Procedure Date: 09/30/2023 7:11 AM MRN: [id number] Date of Birth: February 16, 1946 Attending MD: Tanner Ozell Hollingshead , MD, [id number] CSN: [id number] Age: 78 Admit Type: Outpatient Procedure:                Colonoscopy Indications:              High risk colon cancer surveillance: Personal                            history of colonic polyps Providers:                Tanner Ozell Hollingshead, MD, Leandrew Edelman RN, RN, [place]                            Wilson, Technician Referring MD:              Medicines:                Propofol  per Anesthesia Complications:            No immediate complications. Estimated Blood Loss:     Estimated blood loss was minimal. Procedure:                Pre-Anesthesia Assessment:                           - Prior to the procedure, a History and Physical                            was performed, and patient medications and                            allergies were reviewed. The patient's tolerance of                            previous anesthesia was also reviewed. The risks                            and benefits of the procedure and the sedation                            options and risks were discussed with the patient.                            All questions were answered, and informed consent                            was obtained. Prior Anticoagulants: The patient                            last took Plavix  (clopidogrel ) 1 day prior to the                            procedure. ASA Grade Assessment: III - A patient  with severe systemic disease. After reviewing the                            risks and benefits, the patient was deemed in                            satisfactory condition to undergo the procedure.                           After obtaining informed consent, the colonoscope                            was passed under direct vision. Throughout the                             procedure, the patient's blood pressure, pulse, and                            oxygen saturations were monitored continuously. The                            [id number]) scope was introduced through                            the anus and advanced to the the cecum, identified                            by appendiceal orifice and ileocecal valve. The                            colonoscopy was performed without difficulty. The                            patient tolerated the procedure well. The quality                            of the bowel preparation was adequate. The entire                            colon was well visualized. The ileocecal valve,                            appendiceal orifice, and rectum were photographed. Scope In: 8:05:48 AM Scope Out: 8:57:31 AM Scope Withdrawal Time: 0 hours 33 minutes 3 seconds  Total Procedure Duration: 0 hours 51 minutes 43 seconds  Findings:      The perianal and digital rectal examinations were normal.      Scattered small-mouthed diverticula were found in the sigmoid colon.      15 semi-pedunculated polyps were found in the sigmoid colon, descending       colon, transverse colon, ascending colon and cecum. Size ranged from 5       mm to 1.2 cm. Multiple hot and cold snare polypectomies performed.      The exam was otherwise without abnormality on direct and retroflexion  views aside from a single anal papilla.. Impression:               - Diverticulosis in the sigmoid colon.                           - 15 polyps in the sigmoid colon, in the descending                            colon, in the transverse colon, in the ascending                            colon and in the cecum.                           - The examination was otherwise normal on direct                            and retroflexion views. Anal papilla Moderate Sedation:      Moderate (conscious) sedation was personally administered by an       anesthesia  professional. The following parameters were monitored: oxygen       saturation, heart rate, blood pressure, respiratory rate, EKG, adequacy       of pulmonary ventilation, and response to care. Recommendation:           - Patient has a contact number available for                            emergencies. The signs and symptoms of potential                            delayed complications were discussed with the                            patient. Return to normal activities tomorrow.                            Written discharge instructions were provided to the                            patient.                           - Advance diet as tolerated.                           - Continue present medications. Follow-up pathology.                           - Repeat colonoscopy after studies are complete for                            surveillance.                           - Return to GI office (date not yet determined).  See EGD report. Procedure Code(s):        --- Professional ---                           [phone], Colonoscopy, flexible; diagnostic, including                            collection of specimen(s) by brushing or washing,                            when performed (separate procedure) Diagnosis Code(s):        --- Professional ---                           Z86.010, Personal history of colonic polyps                           D12.5, Benign neoplasm of sigmoid colon                           D12.4, Benign neoplasm of descending colon                           D12.3, Benign neoplasm of transverse colon (hepatic                            flexure or splenic flexure)                           D12.2, Benign neoplasm of ascending colon                           D12.0, Benign neoplasm of cecum                           K57.30, Diverticulosis of large intestine without                            perforation or abscess without bleeding CPT copyright 2022 American  Medical Association. All rights reserved. The codes documented in this report are preliminary and upon coder review may  be revised to meet current compliance requirements. Tanner HERO. Iona Stay, MD Tanner Ozell Hollingshead, MD 09/30/2023 9:18:17 AM This report has been signed electronically. Number of Addenda: 0

## 2023-09-30 NOTE — H&P (Signed)
 @LOGO @   Primary Care Physician:  Marvine Rush, MD Primary Gastroenterologist:  Dr. Shaaron  Pre-Procedure History & Physical: HPI:  [name] is a 78 y.o. male here for further evaluation of potential GERD and dysphagia via EGD and positive Cologuard.  Colonoscopy today per plan.  He remains on Plavix .  Past Medical History:  Diagnosis Date   CAD (coronary artery disease)    s/p PCI   CKD (chronic kidney disease)    Diabetes mellitus    HLD (hyperlipidemia)    HOH (hard of hearing)    Hypertension    Hypothyroidism    Kidney stone    Recurrent kidney stones     Past Surgical History:  Procedure Laterality Date   COLONOSCOPY  07/15/2011   Procedure: COLONOSCOPY;  Surgeon: Oneil DELENA Budge, MD;  Location: [facility];  Service: Gastroenterology;  Laterality: N/A;   CORONARY PRESSURE/FFR STUDY N/A 01/02/2017   Procedure: INTRAVASCULAR PRESSURE WIRE/FFR STUDY;  Surgeon: Wonda Sharper, MD;  Location: [facility] INVASIVE CV LAB;  Service: Cardiovascular;  Laterality: N/A;   CORONARY STENT INTERVENTION N/A 01/02/2017   Procedure: CORONARY STENT INTERVENTION;  Surgeon: Wonda Sharper, MD;  Location: [facility] INVASIVE CV LAB;  Service: Cardiovascular;  Laterality: N/A;   CYSTOSCOPY W/ RETROGRADES Right 01/25/2013   Procedure: CYSTOSCOPY WITH RIGHT RETROGRADE PYELOGRAM, BALLOON DILATION RIGHT URETER AND RIGHT URETERAL STENT PLACEMENT;  Surgeon: Mohammad I Javaid, MD;  Location: [facility];  Service: Urology;  Laterality: Right;   CYSTOSCOPY W/ URETERAL STENT PLACEMENT Left 12/28/2012   Procedure: CYSTOSCOPY WITH RETROGRADE PYELOGRAM/LEFT URETERAL STENT PLACEMENT;  Surgeon: Mohammad I Javaid, MD;  Location: [facility];  Service: Urology;  Laterality: Left;   CYSTOSCOPY W/ URETERAL STENT REMOVAL Left 01/25/2013   Procedure: CYSTOSCOPY WITH LEFT URETERALSTENT REMOVAL;  Surgeon: Emery LILLETTE Blaze, MD;  Location: [facility];  Service: Urology;  Laterality: Left;   EXTRACORPOREAL SHOCK WAVE LITHOTRIPSY Right  01/05/2013   Procedure: EXTRACORPOREAL SHOCK WAVE LITHOTRIPSY (ESWL) RIGHT URETERAL CALCULUS;  Surgeon: Emery LILLETTE Blaze, MD;  Location: [facility];  Service: Urology;  Laterality: Right;   EXTRACORPOREAL SHOCK WAVE LITHOTRIPSY Right 02/02/2013   Procedure: EXTRACORPOREAL SHOCK WAVE LITHOTRIPSY (ESWL) RIGHT URETERAL CALCULUS;  Surgeon: Emery LILLETTE Blaze, MD;  Location: [facility];  Service: Urology;  Laterality: Right;   EXTRACORPOREAL SHOCK WAVE LITHOTRIPSY Right 05/25/2013   Procedure: EXTRACORPOREAL SHOCK WAVE LITHOTRIPSY (ESWL) RIGHT URETERAL CALCULUS;  Surgeon: Emery LILLETTE Blaze, MD;  Location: [facility];  Service: Urology;  Laterality: Right;   HOLMIUM LASER APPLICATION Left 12/28/2012   Procedure: HOLMIUM LASER APPLICATION;  Surgeon: Mohammad I Javaid, MD;  Location: [facility];  Service: Urology;  Laterality: Left;   LEFT HEART CATH AND CORONARY ANGIOGRAPHY N/A 01/02/2017   Procedure: LEFT HEART CATH AND CORONARY ANGIOGRAPHY;  Surgeon: Wonda Sharper, MD;  Location: [facility] INVASIVE CV LAB;  Service: Cardiovascular;  Laterality: N/A;   LITHOTRIPSY     X 2   LITHOTRIPSY     STONE EXTRACTION WITH BASKET Left 12/28/2012   Procedure: STONE EXTRACTION WITH BASKET;  Surgeon: Emery LILLETTE Blaze, MD;  Location: [facility];  Service: Urology;  Laterality: Left;    Prior to Admission medications   Medication Sig Start Date End Date Taking? Authorizing Provider  acetaminophen  (TYLENOL ) 500 MG tablet Take 1,000 mg every 6 (six) hours as needed by mouth for moderate pain or headache.   Yes [provider]  amLODipine -valsartan  (EXFORGE ) 5-160 MG per tablet Take 0.5 tablets daily by mouth.    Yes [provider]  aspirin  EC 81 MG tablet Take 81 mg by mouth daily.   Yes [provider]  atorvastatin  (LIPITOR ) 80 MG tablet TAKE 1 TABLET(80 MG) BY MOUTH DAILY AT 6 PM 10/01/18  Yes Koneswaran, Suresh A, MD  carvedilol  (COREG ) 3.125 MG tablet TAKE 1 TABLET(3.125 MG) BY MOUTH TWICE DAILY WITH A MEAL 10/12/18   Yes Charls Pearla LABOR, MD  clopidogrel  (PLAVIX ) 75 MG tablet Take 1 tablet (75 mg total) by mouth daily with breakfast. 08/20/17  Yes Charls Pearla LABOR, MD  clopidogrel  (PLAVIX ) 75 MG tablet TAKE 1 TABLET BY MOUTH EVERY DAY WITH BREAKFAST 12/30/17  Yes Charls Pearla LABOR, MD  diazepam  (VALIUM ) 10 MG tablet Take 10 mg by mouth 3 (three) times daily as needed for anxiety.   Yes [provider]  doxycycline  (VIBRAMYCIN ) 100 MG capsule Take 1 capsule (100 mg total) by mouth 2 (two) times daily. 10/31/17  Yes Triplett, Tammy, PA-C  escitalopram  (LEXAPRO ) 10 MG tablet Take 10 mg daily by mouth.    Yes [provider]  levothyroxine  (SYNTHROID , LEVOTHROID) 50 MCG tablet Take 50 mcg daily before breakfast by mouth.   Yes [provider]  Naphazoline HCl (CLEAR EYES OP) Place 2 drops as needed into both eyes (for dry eyes).   Yes [provider]  neomycin-polymyxin b-dexamethasone  (MAXITROL) 3.5-10000-0.1 OINT Place 1 application into the right eye 4 (four) times daily. 09/04/17  Yes [provider]  pantoprazole  (PROTONIX ) 40 MG tablet Take 1 tablet (40 mg total) by mouth daily before breakfast. 08/27/23  Yes Rudy, Kristen S, PA-C  potassium chloride  SA (K-DUR) 20 MEQ tablet TAKE 2 TABLETS(40 MEQ) BY MOUTH DAILY 10/11/18  Yes Charls Pearla LABOR, MD  nitroGLYCERIN  (NITROSTAT ) 0.4 MG SL tablet Place 1 tablet (0.4 mg total) under the tongue every 5 (five) minutes as needed. 12/04/15   Charls Pearla LABOR, MD    Allergies as of 09/07/2023   (No Known Allergies)    Family History  Problem Relation Age of Onset   Stroke Mother    Heart attack Father    Colon cancer Paternal Uncle     Social History   Socioeconomic History   Marital status: Single    Spouse name: Not on file   Number of children: Not on file   Years of education: Not on file   Highest education level: Not on file  Occupational History   Not on file  Tobacco Use   Smoking status:  Former    Current packs/day: 0.00    Types: Cigarettes    Quit date: 02/25/1980    Years since quitting: 43.6   Smokeless tobacco: Former    Types: Snuff, Chew  Vaping Use   Vaping status: Never Used  Substance and Sexual Activity   Alcohol use: No   Drug use: No   Sexual activity: Not Currently  Other Topics Concern   Not on file  Social History Narrative   ** Merged History Encounter **       Social Drivers of Corporate investment banker Strain: Not on file  Food Insecurity: Not on file  Transportation Needs: Not on file  Physical Activity: Not on file  Stress: Not on file  Social Connections: Not on file  Intimate Partner Violence: Not on file    Review of Systems: See HPI, otherwise negative ROS  Physical Exam: BP 111/83   Pulse 96   Temp 97.9 F (36.6 C) (Oral)   Resp 14  Ht 5' 8 (1.727 m)   Wt 68.5 kg   SpO2 100%   BMI 22.96 kg/m  General:   Alert,  Well-developed, well-nourished, pleasant and cooperative in NAD Neck:  Supple; no masses or thyromegaly. No significant cervical adenopathy. Lungs:  Clear throughout to auscultation.   No wheezes, crackles, or rhonchi. No acute distress. Heart:  Regular rate and rhythm; no murmurs, clicks, rubs,  or gallops. Abdomen: Non-distended, normal bowel sounds.  Soft and nontender without appreciable mass or hepatosplenomegaly.   Impression/Plan: 78 year old gentleman with poorly controlled GERD and esophageal dysphagia to solids.  Positive Cologuard.  He is here for an EGD with possible esophageal dilation as feasible/appropriate along with a diagnostic colonoscopy per plan.  The risks, benefits, limitations, imponderables and alternatives regarding both EGD and colonoscopy have been reviewed with the patient. Questions have been answered. All parties agreeable.       Notice: This dictation was prepared with Dragon dictation along with smaller phrase technology. Any transcriptional errors that result from this process  are unintentional and may not be corrected upon review.

## 2023-10-01 ENCOUNTER — Ambulatory Visit: Payer: Self-pay | Admitting: Internal Medicine

## 2023-10-01 ENCOUNTER — Encounter (HOSPITAL_COMMUNITY): Payer: Self-pay | Admitting: Internal Medicine

## 2023-10-01 LAB — SURGICAL PATHOLOGY

## 2023-10-13 ENCOUNTER — Telehealth: Payer: Self-pay | Admitting: *Deleted

## 2023-10-13 DIAGNOSIS — K219 Gastro-esophageal reflux disease without esophagitis: Secondary | ICD-10-CM

## 2023-10-13 DIAGNOSIS — R1310 Dysphagia, unspecified: Secondary | ICD-10-CM

## 2023-10-13 NOTE — Telephone Encounter (Signed)
 Received VM from  [place] (on dpr) that patient was supposed to have further testing but has not heard from anyone.  Looked at d/c summary Upper GI series to further evaluate abnormal stomach seen today  Scheduled for 8/22, arrival 8:15am, npo midnight Called [place] back and left detailed message with appt information (ok per dpr).

## 2023-10-16 ENCOUNTER — Ambulatory Visit (HOSPITAL_COMMUNITY)
Admission: RE | Admit: 2023-10-16 | Discharge: 2023-10-16 | Disposition: A | Discharge status: Home / Self Care | Source: Ambulatory Visit | Attending: Internal Medicine | Admitting: Internal Medicine

## 2023-10-16 DIAGNOSIS — K219 Gastro-esophageal reflux disease without esophagitis: Secondary | ICD-10-CM | POA: Insufficient documentation

## 2023-10-16 DIAGNOSIS — R1310 Dysphagia, unspecified: Secondary | ICD-10-CM | POA: Insufficient documentation

## 2023-10-22 ENCOUNTER — Ambulatory Visit: Payer: Self-pay | Admitting: Internal Medicine

## 2023-11-10 DIAGNOSIS — Z23 Encounter for immunization: Secondary | ICD-10-CM | POA: Diagnosis not present

## 2023-11-24 DIAGNOSIS — E785 Hyperlipidemia, unspecified: Secondary | ICD-10-CM | POA: Diagnosis not present

## 2023-11-24 DIAGNOSIS — I10 Essential (primary) hypertension: Secondary | ICD-10-CM | POA: Diagnosis not present

## 2023-11-24 DIAGNOSIS — N1832 Chronic kidney disease, stage 3b: Secondary | ICD-10-CM | POA: Diagnosis not present

## 2023-11-24 DIAGNOSIS — E039 Hypothyroidism, unspecified: Secondary | ICD-10-CM | POA: Diagnosis not present

## 2023-11-24 DIAGNOSIS — K219 Gastro-esophageal reflux disease without esophagitis: Secondary | ICD-10-CM | POA: Diagnosis not present

## 2023-11-24 DIAGNOSIS — E119 Type 2 diabetes mellitus without complications: Secondary | ICD-10-CM | POA: Diagnosis not present

## 2023-11-24 DIAGNOSIS — H04123 Dry eye syndrome of bilateral lacrimal glands: Secondary | ICD-10-CM | POA: Diagnosis not present

## 2023-11-24 DIAGNOSIS — I2510 Atherosclerotic heart disease of native coronary artery without angina pectoris: Secondary | ICD-10-CM | POA: Diagnosis not present

## 2023-11-24 DIAGNOSIS — D638 Anemia in other chronic diseases classified elsewhere: Secondary | ICD-10-CM | POA: Diagnosis not present

## 2023-11-24 DIAGNOSIS — Z7689 Persons encountering health services in other specified circumstances: Secondary | ICD-10-CM | POA: Diagnosis not present

## 2023-11-27 ENCOUNTER — Ambulatory Visit (INDEPENDENT_AMBULATORY_CARE_PROVIDER_SITE_OTHER): Admitting: Internal Medicine

## 2023-11-27 ENCOUNTER — Encounter: Payer: Self-pay | Admitting: Internal Medicine

## 2023-11-27 VITALS — BP 130/70 | HR 75 | Temp 97.5°F | Ht 68.0 in | Wt 155.6 lb

## 2023-11-27 DIAGNOSIS — R1310 Dysphagia, unspecified: Secondary | ICD-10-CM

## 2023-11-27 DIAGNOSIS — K222 Esophageal obstruction: Secondary | ICD-10-CM

## 2023-11-27 DIAGNOSIS — K219 Gastro-esophageal reflux disease without esophagitis: Secondary | ICD-10-CM

## 2023-11-27 DIAGNOSIS — Z860100 Personal history of colon polyps, unspecified: Secondary | ICD-10-CM | POA: Diagnosis not present

## 2023-11-27 NOTE — Patient Instructions (Signed)
 It was good to see you again today  Glad you are doing well  Continue pantoprazole  or Protonix  40 mg daily indefinitely  Will plan to see you back in 1 year and consider doing 1 more colonoscopy and another stretch of your esophagus if needed

## 2023-11-27 NOTE — Progress Notes (Signed)
 Gastroenterology Progress Note    Primary Care Physician:  Marvine Rush, MD Primary Gastroenterologist:  Dr. Shaaron  Pre-Procedure History & Physical: HPI:  [name] is a 78 y.o. male here for follow-up of dysphagia and weight loss.    Patient underwent EGD with Maloney dilation for Schatzki's ring a month before last along with diagnostic colonoscopy for positive Cologuard.  Colonoscopy yielded 15 polyps large and small resected adenomas; he will be due for 1 more colonoscopy in a year.  He had distorted upper GI tract anatomy on EGD ring was dilated with a Maloney.  Upper GI follow-up revealed a large hiatal hernia two thirds of the stomach in the chest barium pill did not hanging at the GE junction.  However patient states he has gained about 30 pounds he is eating very well and not having any more dysphagia he is very pleased with the outcome esophageal dilation.  He takes Protonix  40 mg daily.  Past Medical History:  Diagnosis Date   CAD (coronary artery disease)    s/p PCI   CKD (chronic kidney disease)    Diabetes mellitus    HLD (hyperlipidemia)    HOH (hard of hearing)    Hypertension    Hypothyroidism    Kidney stone    Recurrent kidney stones     Past Surgical History:  Procedure Laterality Date   COLONOSCOPY  07/15/2011   Procedure: COLONOSCOPY;  Surgeon: Oneil DELENA Budge, MD;  [facility];  Service: Gastroenterology;  Laterality: N/A;   COLONOSCOPY N/A 09/30/2023   Procedure: COLONOSCOPY;  Surgeon: Shaaron Lamar HERO, MD;  [facility];  Service: Endoscopy;  Laterality: N/A;  7:30 am, asa 3   CORONARY PRESSURE/FFR STUDY N/A 01/02/2017   Procedure: INTRAVASCULAR PRESSURE WIRE/FFR STUDY;  Surgeon: Wonda Sharper, MD;  Location: [facility] INVASIVE CV LAB;  Service: Cardiovascular;  Laterality: N/A;   CORONARY STENT INTERVENTION N/A 01/02/2017   Procedure: CORONARY STENT INTERVENTION;  Surgeon: Wonda Sharper, MD;  Location: [facility] INVASIVE CV LAB;   Service: Cardiovascular;  Laterality: N/A;   CYSTOSCOPY W/ RETROGRADES Right 01/25/2013   Procedure: CYSTOSCOPY WITH RIGHT RETROGRADE PYELOGRAM, BALLOON DILATION RIGHT URETER AND RIGHT URETERAL STENT PLACEMENT;  Surgeon: Mohammad I Javaid, MD;  Location: [facility];  Service: Urology;  Laterality: Right;   CYSTOSCOPY W/ URETERAL STENT PLACEMENT Left 12/28/2012   Procedure: CYSTOSCOPY WITH RETROGRADE PYELOGRAM/LEFT URETERAL STENT PLACEMENT;  Surgeon: Mohammad I Javaid, MD;  Location: [facility];  Service: Urology;  Laterality: Left;   CYSTOSCOPY W/ URETERAL STENT REMOVAL Left 01/25/2013   Procedure: CYSTOSCOPY WITH LEFT URETERALSTENT REMOVAL;  Surgeon: Emery LILLETTE Blaze, MD;  Location: [facility];  Service: Urology;  Laterality: Left;   ESOPHAGEAL DILATION N/A 09/30/2023   Procedure: DILATION, ESOPHAGUS;  Surgeon: Shaaron Lamar HERO, MD;  [facility];  Service: Endoscopy;  Laterality: N/A;  7:30 am, asa 3   ESOPHAGOGASTRODUODENOSCOPY N/A 09/30/2023   Procedure: EGD (ESOPHAGOGASTRODUODENOSCOPY);  Surgeon: Shaaron Lamar HERO, MD;  [facility];  Service: Endoscopy;  Laterality: N/A;  7:30 am, asa 3   EXTRACORPOREAL SHOCK WAVE LITHOTRIPSY Right 01/05/2013   Procedure: EXTRACORPOREAL SHOCK WAVE LITHOTRIPSY (ESWL) RIGHT URETERAL CALCULUS;  Surgeon: Mohammad I Javaid, MD;  Location: [facility];  Service: Urology;  Laterality: Right;   EXTRACORPOREAL SHOCK WAVE LITHOTRIPSY Right 02/02/2013   Procedure: EXTRACORPOREAL SHOCK WAVE LITHOTRIPSY (ESWL) RIGHT URETERAL CALCULUS;  Surgeon: Emery LILLETTE Blaze, MD;  Location: [facility];  Service: Urology;  Laterality: Right;  EXTRACORPOREAL SHOCK WAVE LITHOTRIPSY Right 05/25/2013   Procedure: EXTRACORPOREAL SHOCK WAVE LITHOTRIPSY (ESWL) RIGHT URETERAL CALCULUS;  Surgeon: Mohammad I Javaid, MD;  Location: [facility];  Service: Urology;  Laterality: Right;   HOLMIUM LASER APPLICATION Left 12/28/2012   Procedure: HOLMIUM LASER APPLICATION;  Surgeon: Mohammad I Javaid, MD;  Location: [facility];  Service: Urology;  Laterality: Left;   LEFT HEART CATH AND CORONARY ANGIOGRAPHY N/A 01/02/2017   Procedure: LEFT HEART CATH AND CORONARY ANGIOGRAPHY;  Surgeon: Wonda Sharper, MD;  Location: [facility] INVASIVE CV LAB;  Service: Cardiovascular;  Laterality: N/A;   LITHOTRIPSY     X 2   LITHOTRIPSY     STONE EXTRACTION WITH BASKET Left 12/28/2012   Procedure: STONE EXTRACTION WITH BASKET;  Surgeon: Emery LILLETTE Blaze, MD;  Location: [facility];  Service: Urology;  Laterality: Left;    Prior to Admission medications   Medication Sig Start Date End Date Taking? Authorizing Provider  acetaminophen  (TYLENOL ) 500 MG tablet Take 1,000 mg every 6 (six) hours as needed by mouth for moderate pain or headache.   Yes [provider]  amLODipine -valsartan  (EXFORGE ) 5-160 MG per tablet Take 0.5 tablets daily by mouth.    Yes [provider]  aspirin  EC 81 MG tablet Take 81 mg by mouth daily.   Yes [provider]  atorvastatin  (LIPITOR ) 80 MG tablet TAKE 1 TABLET(80 MG) BY MOUTH DAILY AT 6 PM 10/01/18  Yes Charls Pearla LABOR, MD  carvedilol  (COREG ) 3.125 MG tablet TAKE 1 TABLET(3.125 MG) BY MOUTH TWICE DAILY WITH A MEAL 10/12/18  Yes Charls Pearla LABOR, MD  clopidogrel  (PLAVIX ) 75 MG tablet TAKE 1 TABLET BY MOUTH EVERY DAY WITH BREAKFAST 12/30/17  Yes Charls Pearla LABOR, MD  escitalopram  (LEXAPRO ) 10 MG tablet Take 10 mg daily by mouth.    Yes [provider]  levothyroxine  (SYNTHROID ) 112 MCG tablet Take 112 mcg by mouth daily. 10/30/23  Yes [provider]  neomycin-polymyxin b-dexamethasone  (MAXITROL) 3.5-10000-0.1 OINT Place 1 application into the right eye 4 (four) times daily. 09/04/17  Yes [provider]  nitroGLYCERIN  (NITROSTAT ) 0.4 MG SL tablet Place 1 tablet (0.4 mg total) under the tongue every 5 (five) minutes as needed. 12/04/15  Yes Charls Pearla LABOR, MD  pantoprazole  (PROTONIX ) 40 MG tablet Take 1 tablet (40 mg total) by mouth daily before  breakfast. 08/27/23  Yes Rudy Josette RAMAN, PA-C    Allergies as of 11/27/2023   (No Known Allergies)    Family History  Problem Relation Age of Onset   Stroke Mother    Heart attack Father    Colon cancer Paternal Uncle     Social History   Socioeconomic History   Marital status: Single    Spouse name: Not on file   Number of children: Not on file   Years of education: Not on file   Highest education level: Not on file  Occupational History   Not on file  Tobacco Use   Smoking status: Former    Current packs/day: 0.00    Types: Cigarettes    Quit date: 02/25/1980    Years since quitting: 43.7   Smokeless tobacco: Former    Types: Snuff, Chew  Vaping Use   Vaping status: Never Used  Substance and Sexual Activity   Alcohol use: No   Drug use: No   Sexual activity: Not Currently  Other Topics Concern   Not on file  Social History Narrative   ** Merged History Encounter **  Social Drivers of Corporate investment banker Strain: Not on file  Food Insecurity: Not on file  Transportation Needs: Not on file  Physical Activity: Not on file  Stress: Not on file  Social Connections: Not on file  Intimate Partner Violence: Not on file    Review of Systems   See HPI, otherwise negative ROS  Physical Exam: BP 130/70 (BP Location: Right Arm, Patient Position: Sitting, Cuff Size: Normal)   Pulse 75   Temp (!) 97.5 F (36.4 C) (Oral)   Ht 5' 8 (1.727 m)   Wt 155 lb 9.6 oz (70.6 kg)   SpO2 100%   BMI 23.66 kg/m  General:   Alert,  Well-developed, well-nourished, pleasant and cooperative in NAD.  Accompanied by caregiver Alan. Heart:  Regular rate and rhythm; no murmurs, clicks, rubs,  or gallops. Abdomen: Non-distended, normal bowel sounds.  Soft and nontender without appreciable mass or hepatosplenomegaly.    Impression/Plan:   78 year old with dysphagia secondary to Schatzki's ring large hiatal hernia much improved with Maloney dilation.  He still has a  tight EG junction based on barium esophagram.  Large burden of colonic polyps removed at colonoscopy 2 months ago.  He remains in fairly good shape.  Very pleased with his progress overall.  Recommendations:  Continue pantoprazole  or Protonix  40 mg daily indefinitely  Will plan to  back in 1 year and consider doing 1 more colonoscopy and another stretch of your esophagus if needed    Notice: This dictation was prepared with Dragon dictation along with smaller phrase technology. Any transcriptional errors that result from this process are unintentional and may not be corrected upon review.

## 2023-11-29 ENCOUNTER — Other Ambulatory Visit: Payer: Self-pay | Admitting: Gastroenterology

## 2023-11-29 DIAGNOSIS — K219 Gastro-esophageal reflux disease without esophagitis: Secondary | ICD-10-CM

## 2023-12-03 ENCOUNTER — Ambulatory Visit: Admitting: Gastroenterology

## 2023-12-26 ENCOUNTER — Encounter (HOSPITAL_COMMUNITY)
Admission: RE | Admit: 2023-12-26 | Discharge: 2023-12-26 | Disposition: A | Discharge status: Home / Self Care | Source: Other Acute Inpatient Hospital | Attending: Nephrology | Admitting: Nephrology

## 2023-12-26 DIAGNOSIS — D638 Anemia in other chronic diseases classified elsewhere: Secondary | ICD-10-CM | POA: Diagnosis not present

## 2023-12-26 DIAGNOSIS — D631 Anemia in chronic kidney disease: Secondary | ICD-10-CM | POA: Insufficient documentation

## 2023-12-26 DIAGNOSIS — R809 Proteinuria, unspecified: Secondary | ICD-10-CM | POA: Insufficient documentation

## 2023-12-26 DIAGNOSIS — N184 Chronic kidney disease, stage 4 (severe): Secondary | ICD-10-CM | POA: Diagnosis not present

## 2023-12-26 DIAGNOSIS — D649 Anemia, unspecified: Secondary | ICD-10-CM | POA: Insufficient documentation

## 2023-12-26 DIAGNOSIS — E1122 Type 2 diabetes mellitus with diabetic chronic kidney disease: Secondary | ICD-10-CM | POA: Diagnosis not present

## 2023-12-26 DIAGNOSIS — N189 Chronic kidney disease, unspecified: Secondary | ICD-10-CM | POA: Insufficient documentation

## 2023-12-26 DIAGNOSIS — I2510 Atherosclerotic heart disease of native coronary artery without angina pectoris: Secondary | ICD-10-CM | POA: Diagnosis not present

## 2023-12-26 LAB — RENAL FUNCTION PANEL
Albumin: 4 g/dL (ref 3.5–5.0)
Anion gap: 12 (ref 5–15)
BUN: 18 mg/dL (ref 8–23)
CO2: 25 mmol/L (ref 22–32)
Calcium: 8.7 mg/dL — ABNORMAL LOW (ref 8.9–10.3)
Chloride: 107 mmol/L (ref 98–111)
Creatinine, Ser: 2.75 mg/dL — ABNORMAL HIGH (ref 0.61–1.24)
GFR, Estimated: 23 mL/min — ABNORMAL LOW (ref >60)
Glucose, Bld: 104 mg/dL — ABNORMAL HIGH (ref 70–99)
Phosphorus: 3 mg/dL (ref 2.5–4.6)
Potassium: 3.8 mmol/L (ref 3.5–5.1)
Sodium: 143 mmol/L (ref 135–145)

## 2023-12-26 LAB — CBC
HCT: 27.5 % — ABNORMAL LOW (ref 39.0–52.0)
Hemoglobin: 8.5 g/dL — ABNORMAL LOW (ref 13.0–17.0)
MCH: 30.7 pg (ref 26.0–34.0)
MCHC: 30.9 g/dL (ref 30.0–36.0)
MCV: 99.3 fL (ref 80.0–100.0)
Platelets: 208 K/uL (ref 150–400)
RBC: 2.77 MIL/uL — ABNORMAL LOW (ref 4.22–5.81)
RDW: 18.2 % — ABNORMAL HIGH (ref 11.5–15.5)
WBC: 7.1 K/uL (ref 4.0–10.5)
nRBC: 0 % (ref 0.0–0.2)

## 2023-12-26 LAB — PROTEIN / CREATININE RATIO, URINE
Creatinine, Urine: 147 mg/dL
Protein Creatinine Ratio: 0.5 mg/mg{creat} — ABNORMAL HIGH (ref 0.00–0.15)
Total Protein, Urine: 73 mg/dL

## 2023-12-26 LAB — IRON AND TIBC
Iron: 60 ug/dL (ref 45–182)
Saturation Ratios: 23 % (ref 17.9–39.5)
TIBC: 263 ug/dL (ref 250–450)
UIBC: 204 ug/dL

## 2023-12-26 LAB — FERRITIN: Ferritin: 48 ng/mL (ref 24–336)

## 2023-12-28 ENCOUNTER — Other Ambulatory Visit (HOSPITAL_COMMUNITY): Payer: Self-pay | Admitting: Nephrology

## 2023-12-28 DIAGNOSIS — N184 Chronic kidney disease, stage 4 (severe): Secondary | ICD-10-CM

## 2023-12-28 DIAGNOSIS — D638 Anemia in other chronic diseases classified elsewhere: Secondary | ICD-10-CM

## 2024-01-01 ENCOUNTER — Ambulatory Visit (HOSPITAL_COMMUNITY)
Admission: RE | Admit: 2024-01-01 | Discharge: 2024-01-01 | Disposition: A | Discharge status: Home / Self Care | Source: Ambulatory Visit | Attending: Nephrology | Admitting: Nephrology

## 2024-01-01 DIAGNOSIS — D638 Anemia in other chronic diseases classified elsewhere: Secondary | ICD-10-CM | POA: Diagnosis present

## 2024-01-01 DIAGNOSIS — N184 Chronic kidney disease, stage 4 (severe): Secondary | ICD-10-CM | POA: Insufficient documentation

## 2024-01-14 ENCOUNTER — Inpatient Hospital Stay

## 2024-01-14 ENCOUNTER — Inpatient Hospital Stay: Admitting: Nurse Practitioner

## 2024-03-18 ENCOUNTER — Inpatient Hospital Stay: Attending: Hematology

## 2024-03-18 ENCOUNTER — Other Ambulatory Visit: Payer: Self-pay | Admitting: Oncology

## 2024-03-18 ENCOUNTER — Inpatient Hospital Stay: Admitting: Hematology

## 2024-03-18 ENCOUNTER — Encounter: Payer: Self-pay | Admitting: Hematology

## 2024-03-18 VITALS — BP 140/69 | HR 66 | Temp 97.1°F | Resp 18 | Ht 67.5 in | Wt 168.0 lb

## 2024-03-18 DIAGNOSIS — D649 Anemia, unspecified: Secondary | ICD-10-CM | POA: Diagnosis not present

## 2024-03-18 LAB — RETIC PANEL
Immature Retic Fract: 16.3 % — ABNORMAL HIGH (ref 2.3–15.9)
RBC.: 3.06 MIL/uL — ABNORMAL LOW (ref 4.22–5.81)
Retic Count, Absolute: 113.8 K/uL (ref 19.0–186.0)
Retic Ct Pct: 3.7 % — ABNORMAL HIGH (ref 0.4–3.1)
Reticulocyte Hemoglobin: 35 pg

## 2024-03-18 LAB — CBC WITH DIFFERENTIAL/PLATELET
Abs Immature Granulocytes: 0.02 K/uL (ref 0.00–0.07)
Basophils Absolute: 0 K/uL (ref 0.0–0.1)
Basophils Relative: 0 %
Eosinophils Absolute: 0.1 K/uL (ref 0.0–0.5)
Eosinophils Relative: 2 %
HCT: 29.7 % — ABNORMAL LOW (ref 39.0–52.0)
Hemoglobin: 9.3 g/dL — ABNORMAL LOW (ref 13.0–17.0)
Immature Granulocytes: 0 %
Lymphocytes Relative: 12 %
Lymphs Abs: 0.7 K/uL (ref 0.7–4.0)
MCH: 30.4 pg (ref 26.0–34.0)
MCHC: 31.3 g/dL (ref 30.0–36.0)
MCV: 97.1 fL (ref 80.0–100.0)
Monocytes Absolute: 0.5 K/uL (ref 0.1–1.0)
Monocytes Relative: 9 %
Neutro Abs: 4.2 K/uL (ref 1.7–7.7)
Neutrophils Relative %: 77 %
Platelets: 205 K/uL (ref 150–400)
RBC: 3.06 MIL/uL — ABNORMAL LOW (ref 4.22–5.81)
RDW: 17.4 % — ABNORMAL HIGH (ref 11.5–15.5)
WBC: 5.5 K/uL (ref 4.0–10.5)
nRBC: 0 % (ref 0.0–0.2)

## 2024-03-18 LAB — SAMPLE TO BLOOD BANK

## 2024-03-18 LAB — LACTATE DEHYDROGENASE: LDH: 201 U/L (ref 105–235)

## 2024-03-18 LAB — FERRITIN: Ferritin: 27 ng/mL (ref 24–336)

## 2024-03-18 LAB — FOLATE: Folate: 9.5 ng/mL

## 2024-03-18 NOTE — Progress Notes (Signed)
 Patient was called for lab results. Physician wanted patient called to let them know that hemoglobin was 9.3 and no blood transfusion was needed, and the blue band could be taken off patient's arm. Patient's caretaker received the message and understood directions.

## 2024-03-18 NOTE — Progress Notes (Signed)
 " [facility]   Telephone:(336) [phone] Fax:(336) [phone]   Clinic New Consult Note   Patient Care Team: Marvine Rush, MD as PCP - General (Family Medicine) Charls Pearla LABOR, MD (Inactive) as PCP - Cardiology (Cardiology) Marvine Rush, MD (Family Medicine) Shaaron Lamar HERO, MD as Consulting Physician (Gastroenterology) 03/18/2024  CHIEF COMPLAINTS/PURPOSE OF CONSULTATION:  Anemia  REFERRING PHYSICIAN: Delsie Riggs, NP   Discussed the use of AI scribe software for clinical note transcription with the patient, who gave verbal consent to proceed.  History of Present Illness [name] is an 79 year old male with chronic anemia and chronic kidney disease who presents for evaluation of progressive anemia.  Anemia was first noted after colonoscopy and related workup in August 2025, when hemoglobin had decreased to 9.3 g/dL from previously normal levels 6 years ago. There is no intervening lab data until five months ago, when hemoglobin was 9.3 g/dL, and most recently 8.5 g/dL 2 months ago. He has not received transfusions and denies overt bleeding. Prior GI evaluation showed a negative stomach biopsy and colon polyps without malignancy. B12 and folate levels are normal. He takes oral B12 but no iron or multivitamins.  He has intermittent fatigue and exertional dyspnea but remains ambulatory and can walk about one mile, sometimes needing breaks. He has reduced his walking distance due to concern about falls. He denies exertional chest pain. He often feels cold and had chills last night. He reports prior weight loss of about 10 pounds to 138 pounds with partial regain. He denies recent bleeding or new gastrointestinal symptoms.  Chronic kidney disease was diagnosed last year after a significant illness and is managed by nephrology. The cause is unclear, and he has hypertension.     MEDICAL HISTORY:  Past Medical History:  Diagnosis Date   CAD (coronary artery  disease)    s/p PCI   CKD (chronic kidney disease)    Diabetes mellitus    HLD (hyperlipidemia)    HOH (hard of hearing)    Hypertension    Hypothyroidism    Kidney stone    Recurrent kidney stones     SURGICAL HISTORY: Past Surgical History:  Procedure Laterality Date   COLONOSCOPY  07/15/2011   Procedure: COLONOSCOPY;  Surgeon: Oneil LABOR Budge, MD;  [facility];  Service: Gastroenterology;  Laterality: N/A;   COLONOSCOPY N/A 09/30/2023   Procedure: COLONOSCOPY;  Surgeon: Shaaron Lamar HERO, MD;  [facility];  Service: Endoscopy;  Laterality: N/A;  7:30 am, asa 3   CORONARY PRESSURE/FFR STUDY N/A 01/02/2017   Procedure: INTRAVASCULAR PRESSURE WIRE/FFR STUDY;  Surgeon: Wonda Sharper, MD;  Location: [facility] INVASIVE CV LAB;  Service: Cardiovascular;  Laterality: N/A;   CORONARY STENT INTERVENTION N/A 01/02/2017   Procedure: CORONARY STENT INTERVENTION;  Surgeon: Wonda Sharper, MD;  Location: [facility] INVASIVE CV LAB;  Service: Cardiovascular;  Laterality: N/A;   CYSTOSCOPY W/ RETROGRADES Right 01/25/2013   Procedure: CYSTOSCOPY WITH RIGHT RETROGRADE PYELOGRAM, BALLOON DILATION RIGHT URETER AND RIGHT URETERAL STENT PLACEMENT;  Surgeon: Mohammad I Javaid, MD;  Location: [facility];  Service: Urology;  Laterality: Right;   CYSTOSCOPY W/ URETERAL STENT PLACEMENT Left 12/28/2012   Procedure: CYSTOSCOPY WITH RETROGRADE PYELOGRAM/LEFT URETERAL STENT PLACEMENT;  Surgeon: Mohammad I Javaid, MD;  Location: [facility];  Service: Urology;  Laterality: Left;   CYSTOSCOPY W/ URETERAL STENT REMOVAL Left 01/25/2013   Procedure: CYSTOSCOPY WITH LEFT URETERALSTENT REMOVAL;  Surgeon: Emery LILLETTE Blaze, MD;  Location: [facility];  Service: Urology;  Laterality: Left;   ESOPHAGEAL DILATION N/A 09/30/2023   Procedure: DILATION, ESOPHAGUS;  Surgeon: Shaaron Lamar HERO, MD;  [facility];  Service: Endoscopy;  Laterality: N/A;  7:30 am, asa 3   ESOPHAGOGASTRODUODENOSCOPY N/A 09/30/2023   Procedure: EGD  (ESOPHAGOGASTRODUODENOSCOPY);  Surgeon: Shaaron Lamar HERO, MD;  [facility];  Service: Endoscopy;  Laterality: N/A;  7:30 am, asa 3   EXTRACORPOREAL SHOCK WAVE LITHOTRIPSY Right 01/05/2013   Procedure: EXTRACORPOREAL SHOCK WAVE LITHOTRIPSY (ESWL) RIGHT URETERAL CALCULUS;  Surgeon: Mohammad I Javaid, MD;  Location: [facility];  Service: Urology;  Laterality: Right;   EXTRACORPOREAL SHOCK WAVE LITHOTRIPSY Right 02/02/2013   Procedure: EXTRACORPOREAL SHOCK WAVE LITHOTRIPSY (ESWL) RIGHT URETERAL CALCULUS;  Surgeon: Emery LILLETTE Blaze, MD;  Location: [facility];  Service: Urology;  Laterality: Right;   EXTRACORPOREAL SHOCK WAVE LITHOTRIPSY Right 05/25/2013   Procedure: EXTRACORPOREAL SHOCK WAVE LITHOTRIPSY (ESWL) RIGHT URETERAL CALCULUS;  Surgeon: Emery LILLETTE Blaze, MD;  Location: [facility];  Service: Urology;  Laterality: Right;   HOLMIUM LASER APPLICATION Left 12/28/2012   Procedure: HOLMIUM LASER APPLICATION;  Surgeon: Mohammad I Javaid, MD;  Location: [facility];  Service: Urology;  Laterality: Left;   LEFT HEART CATH AND CORONARY ANGIOGRAPHY N/A 01/02/2017   Procedure: LEFT HEART CATH AND CORONARY ANGIOGRAPHY;  Surgeon: Wonda Sharper, MD;  Location: [facility] INVASIVE CV LAB;  Service: Cardiovascular;  Laterality: N/A;   LITHOTRIPSY     X 2   LITHOTRIPSY     STONE EXTRACTION WITH BASKET Left 12/28/2012   Procedure: STONE EXTRACTION WITH BASKET;  Surgeon: Emery LILLETTE Blaze, MD;  Location: [facility];  Service: Urology;  Laterality: Left;    SOCIAL HISTORY: Social History   Socioeconomic History   Marital status: Single    Spouse name: Not on file   Number of children: 0   Years of education: Not on file   Highest education level: Not on file  Occupational History   Not on file  Tobacco Use   Smoking status: Former    Current packs/day: 0.00    Types: Cigarettes    Quit date: 02/25/1980    Years since quitting: 44.0   Smokeless tobacco: Former    Types: Snuff, Chew  Vaping Use   Vaping status: Never  Used  Substance and Sexual Activity   Alcohol use: No   Drug use: No   Sexual activity: Not Currently  Other Topics Concern   Not on file  Social History Narrative   ** Merged History Encounter **       Social Drivers of Health   Tobacco Use: Medium Risk (11/27/2023)   Patient History    Smoking Tobacco Use: Former    Smokeless Tobacco Use: Former    Passive Exposure: Not on Stage Manager: Not on Ship Broker Insecurity: Not on file  Transportation Needs: Not on file  Physical Activity: Not on file  Stress: Not on file  Social Connections: Not on file  Intimate Partner Violence: Not on file  Depression (PHQ2-9): Low Risk (03/18/2024)   Depression (PHQ2-9)    PHQ-2 Score: 1  Alcohol Screen: Not on file  Housing: Not on file  Utilities: Not on file  Health Literacy: Not on file    FAMILY HISTORY: Family History  Problem Relation Age of Onset   Stroke Mother    Heart attack Father    Colon cancer Paternal Uncle     ALLERGIES:  has no known allergies.  MEDICATIONS:  Current Outpatient  Medications  Medication Sig Dispense Refill   acetaminophen  (TYLENOL ) 500 MG tablet Take 1,000 mg every 6 (six) hours as needed by mouth for moderate pain or headache.     amLODipine -valsartan  (EXFORGE ) 5-160 MG per tablet Take 0.5 tablets daily by mouth.      aspirin  EC 81 MG tablet Take 81 mg by mouth daily.     atorvastatin  (LIPITOR ) 80 MG tablet TAKE 1 TABLET(80 MG) BY MOUTH DAILY AT 6 PM 90 tablet 3   carvedilol  (COREG ) 3.125 MG tablet TAKE 1 TABLET(3.125 MG) BY MOUTH TWICE DAILY WITH A MEAL 60 tablet 6   clopidogrel  (PLAVIX ) 75 MG tablet TAKE 1 TABLET BY MOUTH EVERY DAY WITH BREAKFAST 90 tablet 3   escitalopram  (LEXAPRO ) 10 MG tablet Take 10 mg daily by mouth.      hydrALAZINE  (APRESOLINE ) 50 MG tablet Take 50 mg by mouth 2 (two) times daily.     levothyroxine  (SYNTHROID ) 50 MCG tablet Take 50 mcg by mouth every morning.     neomycin-polymyxin b-dexamethasone   (MAXITROL) 3.5-10000-0.1 OINT Place 1 application into the right eye 4 (four) times daily.  0   nitroGLYCERIN  (NITROSTAT ) 0.4 MG SL tablet Place 1 tablet (0.4 mg total) under the tongue every 5 (five) minutes as needed. 25 tablet 3   pantoprazole  (PROTONIX ) 40 MG tablet TAKE 1 TABLET(40 MG) BY MOUTH DAILY BEFORE BREAKFAST 30 tablet 5   No current facility-administered medications for this visit.    REVIEW OF SYSTEMS:   Constitutional: Denies fevers, chills or abnormal night sweats Eyes: Denies blurriness of vision, double vision or watery eyes Ears, nose, mouth, throat, and face: Denies mucositis or sore throat Respiratory: Denies cough, dyspnea or wheezes Cardiovascular: Denies palpitation, chest discomfort or lower extremity swelling Gastrointestinal:  Denies nausea, heartburn or change in bowel habits Skin: Denies abnormal skin rashes Lymphatics: Denies new lymphadenopathy or easy bruising Neurological:Denies numbness, tingling or new weaknesses Behavioral/Psych: Mood is stable, no new changes  All other systems were reviewed with the patient and are negative.  PHYSICAL EXAMINATION: ECOG PERFORMANCE STATUS: 1 - Symptomatic but completely ambulatory  Vitals:   03/18/24 0925  BP: (!) 140/69  Pulse: 66  Resp: 18  Temp: (!) 97.1 F (36.2 C)  SpO2: 99%   Filed Weights   03/18/24 0925  Weight: 168 lb (76.2 kg)    GENERAL:alert, no distress and comfortable SKIN: skin color, texture, turgor are normal, no rashes or significant lesions EYES: normal, conjunctiva are pink and non-injected, sclera clear OROPHARYNX:no exudate, no erythema and lips, buccal mucosa, and tongue normal  NECK: supple, thyroid  normal size, non-tender, without nodularity LYMPH:  no palpable lymphadenopathy in the cervical, axillary or inguinal LUNGS: clear to auscultation and percussion with normal breathing effort HEART: regular rate & rhythm and no murmurs and no lower extremity edema ABDOMEN:abdomen  soft, non-tender and normal bowel sounds Musculoskeletal:no cyanosis of digits and no clubbing  PSYCH: alert & oriented x 3 with fluent speech NEURO: no focal motor/sensory deficits  Physical Exam ABDOMEN: Non-tender abdomen EXTREMITIES: No swelling in extremities  LABORATORY DATA:  I have reviewed the data as listed    Latest Ref Rng & Units 12/26/2023    9:12 AM 09/24/2023   10:28 AM 10/31/2017   10:21 AM  CBC  WBC 4.0 - 10.5 K/uL 7.1  10.7  5.7   Hemoglobin 13.0 - 17.0 g/dL 8.5  9.3  87.2   Hematocrit 39.0 - 52.0 % 27.5  29.8  38.1   Platelets 150 -  400 K/uL 208  464  161       Latest Ref Rng & Units 12/26/2023    9:12 AM 09/24/2023   10:28 AM 10/31/2017   10:21 AM  CMP  Glucose 70 - 99 mg/dL 895  880  866   BUN 8 - 23 mg/dL 18  64  11   Creatinine 0.61 - 1.24 mg/dL 7.24  6.20  8.91   Sodium 135 - 145 mmol/L 143  139  139   Potassium 3.5 - 5.1 mmol/L 3.8  4.6  3.2   Chloride 98 - 111 mmol/L 107  110  104   CO2 22 - 32 mmol/L 25  17  25    Calcium  8.9 - 10.3 mg/dL 8.7  8.9  9.1      RADIOGRAPHIC STUDIES: I have personally reviewed the radiological images as listed and agreed with the findings in the report. No results found.   Assessment & Plan Normocytic anemia, likely anemia of chronic disease secondary to CKD, rule out MDS Chronic anemia for approximately five months with hemoglobin decline from 9.3 to 8.5 g/dL in the past 6 months. Nutritional deficiencies (iron, B12, folate) excluded by normal labs. No evidence of gastrointestinal bleeding or malignancy on recent endoscopy. Chronic kidney disease may contribute, but anemia is more severe than expected for his renal dysfunction. Myelodysplastic syndrome remains in the differential. He is symptomatic with intermittent fatigue, exertional dyspnea, chills, and cold intolerance, but remains ambulatory and independent in activities of daily living. No transfusions required to date. - Ordered repeat blood tests to reassess  anemia and exclude other etiologies. - Scheduled bone marrow biopsy in the coming weeks to evaluate for myelodysplastic syndrome or other marrow disorders. - Arranged follow-up with nurse practitioner post-biopsy to review results and initiate therapy as indicated. - Provided anticipatory guidance regarding potential need for transfusion if hemoglobin falls below 8 g/dL; sent blood sample for crossmatch. - Discussed possible initiation of erythropoiesis-stimulating agent injections if myelodysplastic syndrome is excluded and anemia persists, with goal hemoglobin 10-11 g/dL (not normalized) due to increased thrombosis risk at higher targets. - Recommended prenatal multivitamin with iron, B12, and folate supplementation. - Advised increased intake of high-protein, high-calorie foods.  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  Plan - Lab and endoscopy results reviewed -lab today - Schedule a bone marrow biopsy in the next few weeks - Follow-up with NP at [facility] after bone marrow biopsy   Orders Placed This Encounter  Procedures   IR BONE MARROW BIOPSY & ASPIRATION    Standing Status:   Future    Expiration Date:   03/18/2025    Reason for Exam (SYMPTOM  OR DIAGNOSIS REQUIRED):   rule out MDS    Preferred Imaging Location?:   [place]   CBC with Differential/Platelet    Standing Status:   Future    Number of Occurrences:   1    Expected Date:   03/18/2024    Expiration Date:   06/16/2024   Methylmalonic acid, serum    Standing Status:   Future    Number of Occurrences:   1    Expected Date:   03/18/2024    Expiration Date:   06/16/2024   Folate, Serum    Standing Status:   Future    Number of Occurrences:   1    Expected Date:   03/18/2024    Expiration Date:   06/16/2024   Ferritin    Standing Status:   Future  Number of Occurrences:   1    Expected Date:   03/18/2024    Expiration Date:   06/16/2024   Erythropoietin    Standing Status:   Future    Number of Occurrences:   1     Expected Date:   03/18/2024    Expiration Date:   06/16/2024   Retic Panel    Standing Status:   Future    Number of Occurrences:   1    Expected Date:   03/18/2024    Expiration Date:   06/16/2024   Lactate dehydrogenase (LDH)    Standing Status:   Future    Number of Occurrences:   1    Expected Date:   03/18/2024    Expiration Date:   03/18/2025   Sample to Blood Bank    Standing Status:   Future    Number of Occurrences:   1    Expected Date:   03/18/2024    Expiration Date:   03/18/2025    All questions were answered. The patient knows to call the clinic with any problems, questions or concerns. I spent 30 minutes counseling the patient face to face. The total time spent in the appointment was 45 minutes including review of chart and various tests results, discussions about plan of care and coordination of care plan.     Onita Mattock, MD 03/18/2024 10:07 AM    "

## 2024-03-19 LAB — ERYTHROPOIETIN: Erythropoietin: 26.9 m[IU]/mL — ABNORMAL HIGH (ref 2.6–18.5)

## 2024-03-22 LAB — METHYLMALONIC ACID, SERUM: Methylmalonic Acid, Quantitative: 234 nmol/L (ref 0–378)

## 2024-04-12 ENCOUNTER — Ambulatory Visit (HOSPITAL_COMMUNITY)

## 2024-04-18 ENCOUNTER — Ambulatory Visit: Admitting: Urology

## 2024-05-03 ENCOUNTER — Inpatient Hospital Stay: Attending: Hematology | Admitting: Oncology
